# Patient Record
Sex: Female | Born: 1985 | Race: White | Hispanic: Yes | Smoking: Never smoker
Health system: Southern US, Community
[De-identification: ages and names within clinical notes are randomized; demographics above are authoritative.]

## PROBLEM LIST (undated history)

## (undated) HISTORY — PX: BREAST SURGERY: SHX581

---

## 2009-07-06 ENCOUNTER — Emergency Department (HOSPITAL_COMMUNITY): Admission: EM | Admit: 2009-07-06 | Discharge: 2009-07-06 | Payer: Self-pay | Admitting: Emergency Medicine

## 2010-02-03 ENCOUNTER — Emergency Department (HOSPITAL_COMMUNITY): Admission: EM | Admit: 2010-02-03 | Discharge: 2010-02-03 | Payer: Self-pay | Admitting: Emergency Medicine

## 2010-02-04 ENCOUNTER — Emergency Department (HOSPITAL_COMMUNITY): Admission: EM | Admit: 2010-02-04 | Discharge: 2010-02-04 | Payer: Self-pay | Admitting: Emergency Medicine

## 2010-12-02 LAB — URINALYSIS, ROUTINE W REFLEX MICROSCOPIC
Ketones, ur: 15 mg/dL — AB
Protein, ur: 100 mg/dL — AB
Urobilinogen, UA: 1 mg/dL (ref 0.0–1.0)

## 2010-12-02 LAB — URINE MICROSCOPIC-ADD ON

## 2010-12-02 LAB — RAPID STREP SCREEN (MED CTR MEBANE ONLY): Streptococcus, Group A Screen (Direct): NEGATIVE

## 2010-12-19 LAB — RAPID STREP SCREEN (MED CTR MEBANE ONLY): Streptococcus, Group A Screen (Direct): NEGATIVE

## 2010-12-19 LAB — URINALYSIS, ROUTINE W REFLEX MICROSCOPIC
Glucose, UA: NEGATIVE mg/dL
Protein, ur: NEGATIVE mg/dL
Specific Gravity, Urine: 1.018 (ref 1.005–1.030)
Urobilinogen, UA: 1 mg/dL (ref 0.0–1.0)

## 2010-12-19 LAB — URINE MICROSCOPIC-ADD ON

## 2011-10-31 ENCOUNTER — Encounter (HOSPITAL_COMMUNITY): Payer: Self-pay | Admitting: *Deleted

## 2011-10-31 ENCOUNTER — Emergency Department (HOSPITAL_COMMUNITY): Payer: Self-pay

## 2011-10-31 ENCOUNTER — Emergency Department (HOSPITAL_COMMUNITY)
Admission: EM | Admit: 2011-10-31 | Discharge: 2011-11-01 | Payer: Self-pay | Attending: Emergency Medicine | Admitting: Emergency Medicine

## 2011-10-31 DIAGNOSIS — S0003XA Contusion of scalp, initial encounter: Secondary | ICD-10-CM | POA: Insufficient documentation

## 2011-10-31 DIAGNOSIS — R220 Localized swelling, mass and lump, head: Secondary | ICD-10-CM | POA: Insufficient documentation

## 2011-10-31 DIAGNOSIS — S0180XA Unspecified open wound of other part of head, initial encounter: Secondary | ICD-10-CM | POA: Insufficient documentation

## 2011-10-31 DIAGNOSIS — IMO0002 Reserved for concepts with insufficient information to code with codable children: Secondary | ICD-10-CM

## 2011-10-31 MED ORDER — ACETAMINOPHEN 325 MG PO TABS
975.0000 mg | ORAL_TABLET | Freq: Once | ORAL | Status: AC
  Administered 2011-10-31: 975 mg via ORAL
  Filled 2011-10-31: qty 3

## 2011-10-31 MED ORDER — TETANUS-DIPHTH-ACELL PERTUSSIS 5-2.5-18.5 LF-MCG/0.5 IM SUSP
0.5000 mL | Freq: Once | INTRAMUSCULAR | Status: AC
  Administered 2011-10-31: 0.5 mL via INTRAMUSCULAR
  Filled 2011-10-31: qty 0.5

## 2011-10-31 MED ORDER — BACITRACIN ZINC 500 UNIT/GM EX OINT
TOPICAL_OINTMENT | CUTANEOUS | Status: AC
  Filled 2011-10-31: qty 0.9

## 2011-10-31 NOTE — ED Notes (Signed)
Patient transported to CT 
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
## 2011-11-01 NOTE — Discharge Instructions (Signed)
Laceration Care, Child A laceration is a cut that goes through all layers of the skin. The cut goes into the tissue beneath the skin. HOME CARE For stitches (sutures) or staples:  Keep the cut clean and dry.   If your child has a bandage (dressing), change it at least once a day. Change the bandage if it gets wet or dirty, or as told by your doctor.   Wash the cut with soap and water 2 times a day. Rinse the cut with water. Pat it dry with a clean towel.   Put a thin layer of medicated cream on the cut as told by your doctor.   Your child may shower after the first 24 hours. Do not soak the cut in water until the stitches are removed.   Only give medicines as told by your doctor.   Have the stitches or staples removed as told by your doctor.  For skin glue (adhesive) strips:  Keep the cut clean and dry.   Do not get the strips wet. Your child may take a bath, but be careful to keep the cut dry.   If the cut gets wet, pat it dry with a clean towel.   The strips will fall off on their own. Do not remove the strips that are still stuck to the cut.  For wound glue:  Your child may shower or take baths. Do not soak or scrub the cut. Do not swim. Avoid heavy sweating until the glue falls off on its own. After a shower or bath, pat the cut dry with a clean towel.   Do not put medicine on your child's cut until the glue falls off.   If your child has a bandage, do not put tape over the glue.   Avoid lots of sunlight or tanning lamps until the glue falls off. Put sunscreen on the cut for the first year to reduce the scar.   The glue will fall off on its own. Do not let your child pick at the glue.  Your child may need a tetanus shot if:  You cannot remember when your child had his or her last tetanus shot.   Your child has never had a tetanus shot.  If your child needs a tetanus shot and you choose not to have one, your child may get tetanus. Sickness from tetanus can be  serious. GET HELP RIGHT AWAY IF:   Your child's cut is red, puffy (swollen), or painful.   You see yellowish-white fluid (pus) coming from the cut.   You see a red line on the skin coming from the cut.   You notice a bad smell coming from the cut or bandage.   Your child has a fever.   Your baby is 75 months old or younger with a rectal temperature of 100.4 F (38 C) or higher.   Your child's cut breaks open.   You see something coming out of the cut, such as wood or glass.   Your child cannot move a finger or toe.   Your child's arm, hand, leg, or foot loses feeling (numbness) or changes color.  MAKE SURE YOU:   Understand these instructions.   Will watch your child's condition.   Will get help right away if your child is not doing well or gets worse.  Document Released: 06/10/2008 Document Revised: 05/14/2011 Document Reviewed: 03/06/2011 [facility] Patient Information 2012 [place], [place].  Take Tylenol as needed for pain, sutures to come out  in 5 days

## 2013-06-25 ENCOUNTER — Encounter (HOSPITAL_COMMUNITY): Payer: Self-pay | Admitting: Emergency Medicine

## 2013-06-25 ENCOUNTER — Emergency Department (HOSPITAL_COMMUNITY)
Admission: EM | Admit: 2013-06-25 | Discharge: 2013-06-26 | Disposition: A | Discharge status: Home / Self Care | Payer: PRIVATE HEALTH INSURANCE | Attending: Emergency Medicine | Admitting: Emergency Medicine

## 2013-06-25 DIAGNOSIS — N920 Excessive and frequent menstruation with regular cycle: Secondary | ICD-10-CM | POA: Insufficient documentation

## 2013-06-25 DIAGNOSIS — Z3202 Encounter for pregnancy test, result negative: Secondary | ICD-10-CM | POA: Insufficient documentation

## 2013-06-25 DIAGNOSIS — R109 Unspecified abdominal pain: Secondary | ICD-10-CM | POA: Insufficient documentation

## 2013-06-25 LAB — CBC
Hemoglobin: 12.8 g/dL (ref 12.0–15.0)
MCH: 30.2 pg (ref 26.0–34.0)
MCHC: 36.1 g/dL — ABNORMAL HIGH (ref 30.0–36.0)
MCV: 83.7 fL (ref 78.0–100.0)
RBC: 4.24 MIL/uL (ref 3.87–5.11)

## 2013-06-25 MED ORDER — NORETHINDRONE ACETATE 5 MG PO TABS: 5.0000 mg | ORAL_TABLET | Freq: Every day | ORAL | Status: DC

## 2013-06-25 MED ORDER — LEVONORGESTREL-ETHINYL ESTRAD 0.15-30 MG-MCG PO TABS: 1.0000 | ORAL_TABLET | Freq: Every day | ORAL | Status: DC

## 2013-06-25 NOTE — ED Provider Notes (Signed)
CSN: [id number]     Arrival date & time 06/25/13  2050 History   First MD Initiated Contact with Patient 06/25/13 2308     Chief Complaint  Patient presents with  . Vaginal Bleeding   (Consider location/radiation/quality/duration/timing/severity/associated sxs/prior Treatment) HPI This patient is a generally healthy young woman who presents with complaints of persistent daily vaginal bleeding x 15 days. She says she had the same sx in June. She saw a nurse practitioner at a local clinic and was treated with "a shot to stop the bleeding" and prescribed Indocin for cramping pain. Bleeding abated and the patient was amenorrheic for the next 3 months. She developed vaginal bleeding again about 15d ago and it has been persistent. She denies passage of large clots. She has been using approx 4-5 pads per day.   She has intermittent, midline lower abdominal cramping. No unusual vaginal discharge. States had a normal pap smear approx 1 year ago. Denies unusual vaginal discharge and dysuria.   History reviewed. No pertinent past medical history. History reviewed. No pertinent past surgical history. No family history on file. History  Substance Use Topics  . Smoking status: Never Smoker   . Smokeless tobacco: Not on file  . Alcohol Use: No   OB History   Grav Para Term Preterm Abortions TAB SAB Ect Mult Living                 Review of Systems Gen: no weight loss, fevers, chills, night sweats Eyes: no discharge or drainage, no occular pain or visual changes Nose: no epistaxis or rhinorrhea Mouth: no dental pain, no sore throat Neck: no neck pain Lungs: no SOB, cough, wheezing CV: no chest pain, palpitations, dependent edema or orthopnea Abd: no abdominal pain, nausea, vomiting GU: As per history of present illness otherwise negative MSK: no myalgias or arthralgias Neuro: no headache, no focal neurologic deficits Skin: no rash Psyche: negative.  Allergies  Review of patient's  allergies indicates no known allergies.  Home Medications  No current outpatient prescriptions on file. BP 114/76  Pulse 79  Temp(Src) 98.4 F (36.9 C) (Oral)  Resp 16  Wt 139 lb 3.2 oz (63.141 kg)  SpO2 99%  LMP 06/25/2013 Physical Exam Gen: well developed and well nourished appearing Head: NCAT Eyes: PERL, EOMI Nose: no epistaixis or rhinorrhea Mouth/throat: mucosa is moist and pink Neck: supple, no stridor Lungs: CTA B, no wheezing, rhonchi or rales CV: rr, no murmur, ext well perfused, cap refill < 2s Abd: soft, notender, nondistended Back: no ttp, no cva ttp Skin: warm and dry Neuro: CN ii-xii grossly intact, no focal deficits Psyche; normal affect,  calm and cooperative.   ED Course  Procedures (including critical care time) Labs Review  Results for orders placed during the hospital encounter of 06/25/13 (from the past 24 hour(s))  CBC     Status: Abnormal   Collection Time    06/25/13  9:09 PM      Result Value Range   WBC 8.1  4.0 - 10.5 K/uL   RBC 4.24  3.87 - 5.11 MIL/uL   Hemoglobin 12.8  12.0 - 15.0 g/dL   HCT 40.9 (*) 81.1 - 91.4 %   MCV 83.7  78.0 - 100.0 fL   MCH 30.2  26.0 - 34.0 pg   MCHC 36.1 (*) 30.0 - 36.0 g/dL   RDW 78.2  95.6 - 21.3 %   Platelets 258  150 - 400 K/uL  POCT PREGNANCY, URINE  Status: None   Collection Time    06/25/13 11:00 PM      Result Value Range   Preg Test, Ur NEGATIVE  NEGATIVE     MDM  Patient with menorrhagia. We will tx with norethidrone followed by OCP and refer to GYN for outpatient f/u. Patient is well insured. She is counseled to return to the ED for red flag sx.    Brandt Loosen, MD 06/25/13 [phone]

## 2013-06-25 NOTE — ED Notes (Signed)
Patient asked for urine sample, she states that she is unable to give sample at this time. 

## 2013-06-25 NOTE — ED Notes (Signed)
Moderate vaginal bleeding x 15 days; missed menstrual cycle last month. Went to pcp and prescribed 4 meds. Feeling weak. And umbilical pain.

## 2015-10-31 ENCOUNTER — Ambulatory Visit (INDEPENDENT_AMBULATORY_CARE_PROVIDER_SITE_OTHER): Payer: Managed Care, Other (non HMO) | Admitting: Family Medicine

## 2015-10-31 VITALS — BP 102/66 | HR 102 | Temp 98.8°F | Resp 18 | Ht 66.5 in | Wt 145.2 lb

## 2015-10-31 DIAGNOSIS — B349 Viral infection, unspecified: Secondary | ICD-10-CM

## 2015-10-31 DIAGNOSIS — R509 Fever, unspecified: Secondary | ICD-10-CM

## 2015-10-31 LAB — POCT INFLUENZA A/B
Influenza A, POC: NEGATIVE
Influenza B, POC: NEGATIVE

## 2015-10-31 MED ORDER — OSELTAMIVIR PHOSPHATE 75 MG PO CAPS: 75.0000 mg | ORAL_CAPSULE | Freq: Two times a day (BID) | ORAL | Status: DC

## 2015-10-31 NOTE — Addendum Note (Signed)
Addended by: Elenora Gamma on: 10/31/2015 10:16 AM   Modules accepted: Level of Service, SmartSet

## 2015-10-31 NOTE — Patient Instructions (Addendum)
Great to meet you!  Your flu test was negative, It misses about 3 out of 10 and you have all of the signs of flu in peak flu season so I have sent you the medicine for the flu  Wash your hand routinely  Please come back if you have any concerns  Influenza, Adult Influenza ("the flu") is a viral infection of the respiratory tract. It occurs more often in winter months because people spend more time in close contact with one another. Influenza can make you feel very sick. Influenza easily spreads from person to person (contagious). CAUSES  Influenza is caused by a virus that infects the respiratory tract. You can catch the virus by breathing in droplets from an infected person's cough or sneeze. You can also catch the virus by touching something that was recently contaminated with the virus and then touching your mouth, nose, or eyes. RISKS AND COMPLICATIONS You may be at risk for a more severe case of influenza if you smoke cigarettes, have diabetes, have chronic heart disease (such as heart failure) or lung disease (such as asthma), or if you have a weakened immune system. Elderly people and pregnant women are also at risk for more serious infections. The most common problem of influenza is a lung infection (pneumonia). Sometimes, this problem can require emergency medical care and may be life threatening. SIGNS AND SYMPTOMS  Symptoms typically last 4 to 10 days and may include:  Fever.  Chills.  Headache, body aches, and muscle aches.  Sore throat.  Chest discomfort and cough.  Poor appetite.  Weakness or feeling tired.  Dizziness.  Nausea or vomiting. DIAGNOSIS  Diagnosis of influenza is often made based on your history and a physical exam. A nose or throat swab test can be done to confirm the diagnosis. TREATMENT  In mild cases, influenza goes away on its own. Treatment is directed at relieving symptoms. For more severe cases, your health care provider may prescribe antiviral  medicines to shorten the sickness. Antibiotic medicines are not effective because the infection is caused by a virus, not by bacteria. HOME CARE INSTRUCTIONS  Take medicines only as directed by your health care provider.  Use a cool mist humidifier to make breathing easier.  Get plenty of rest until your temperature returns to normal. This usually takes 3 to 4 days.  Drink enough fluid to keep your urine clear or pale yellow.  Cover yourmouth and nosewhen coughing or sneezing,and wash your handswellto prevent thevirusfrom spreading.  Stay homefromwork orschool untilthe fever is gonefor at least 75full day. PREVENTION  An annual influenza vaccination (flu shot) is the best way to avoid getting influenza. An annual flu shot is now routinely recommended for all adults in the [place] SEEK MEDICAL CARE IF:  You experiencechest pain, yourcough worsens,or you producemore mucus.  Youhave nausea,vomiting, ordiarrhea.  Your fever returns or gets worse. SEEK IMMEDIATE MEDICAL CARE IF:  You havetrouble breathing, you become short of breath,or your skin ornails becomebluish.  You have severe painor stiffnessin the neck.  You develop a sudden headache, or pain in the face or ear.  You have nausea or vomiting that you cannot control. MAKE SURE YOU:   Understand these instructions.  Will watch your condition.  Will get help right away if you are not doing well or get worse.   This information is not intended to replace advice given to you by your health care provider. Make sure you discuss any questions you have with your health  care provider.   Document Released: 08/29/2000 Document Revised: 09/22/2014 Document Reviewed: 12/01/2011 Elsevier Interactive Patient Education [employer].

## 2015-10-31 NOTE — Progress Notes (Signed)
   HPI  Patient presents today here with acute illness, concern for flu  She describes rapid onset of symptoms including subjective fever, chills, malaise, body aches, cough, rhinnorhea, and intermittent dyspnea Monday night (24-36 hours ago)  She had worsening one day ago  She has a sick contact with similar illness at work  LMP 3 weeks ago without sexual activity since  Denies current dyspnea, chest pain or PO intolerance  PMH: Smoking status noted Social Hx updated and reviewed in EMR ROS: Per HPI  Objective: BP 102/66 mmHg  Pulse 102  Temp(Src) 98.8 F (37.1 C) (Oral)  Resp 18  Ht 5' 6.5" (1.689 m)  Wt 145 lb 3.2 oz (65.862 kg)  BMI 23.09 kg/m2  SpO2 99% Gen: NAD, alert, cooperative with exam HEENT: NCAT,TMs WNL, Nares with erythema, oropharynx clear, MMM CV: RRR, good S1/S2, no murmur Resp: CTABL, no wheezes, non-labored Ext: No edema, warm Neuro: Alert and oriented, No gross deficits  Assessment and plan:  # Viral illness, clinically Flu Treating as flu despite negative rapid flu, peak season currently No signs of CAP Tamiflu, supportive care discussed 2-4 days out of work She is on approx hour 36 of illness RTC if worsening or does not improve as expected.,    Orders Placed This Encounter  Procedures  . POCT Influenza A/B    Meds ordered this encounter  Medications  . IBUPROFEN PO    Sig: Take by mouth.    Murtis Sink, MD [facility] 10/31/2015, 9:31 AM

## 2016-05-17 ENCOUNTER — Emergency Department (HOSPITAL_COMMUNITY): Payer: Worker's Compensation

## 2016-05-17 ENCOUNTER — Encounter (HOSPITAL_COMMUNITY): Payer: Self-pay

## 2016-05-17 ENCOUNTER — Emergency Department (HOSPITAL_COMMUNITY)
Admission: EM | Admit: 2016-05-17 | Discharge: 2016-05-17 | Disposition: A | Discharge status: Home / Self Care | Payer: Worker's Compensation | Attending: Emergency Medicine | Admitting: Emergency Medicine

## 2016-05-17 DIAGNOSIS — Y990 Civilian activity done for income or pay: Secondary | ICD-10-CM | POA: Diagnosis not present

## 2016-05-17 DIAGNOSIS — S39012A Strain of muscle, fascia and tendon of lower back, initial encounter: Secondary | ICD-10-CM | POA: Diagnosis not present

## 2016-05-17 DIAGNOSIS — T148XXA Other injury of unspecified body region, initial encounter: Secondary | ICD-10-CM

## 2016-05-17 DIAGNOSIS — X500XXA Overexertion from strenuous movement or load, initial encounter: Secondary | ICD-10-CM | POA: Diagnosis not present

## 2016-05-17 DIAGNOSIS — Y929 Unspecified place or not applicable: Secondary | ICD-10-CM | POA: Diagnosis not present

## 2016-05-17 DIAGNOSIS — S3992XA Unspecified injury of lower back, initial encounter: Secondary | ICD-10-CM | POA: Diagnosis present

## 2016-05-17 DIAGNOSIS — Y9389 Activity, other specified: Secondary | ICD-10-CM | POA: Diagnosis not present

## 2016-05-17 DIAGNOSIS — M62830 Muscle spasm of back: Secondary | ICD-10-CM

## 2016-05-17 MED ORDER — CYCLOBENZAPRINE HCL 5 MG PO TABS: 5.0000 mg | ORAL_TABLET | Freq: Three times a day (TID) | ORAL | 0 refills | Status: DC | PRN

## 2016-05-17 MED ORDER — IBUPROFEN 400 MG PO TABS
600.0000 mg | ORAL_TABLET | Freq: Once | ORAL | Status: AC
  Administered 2016-05-17: 600 mg via ORAL
  Filled 2016-05-17: qty 1

## 2016-05-17 MED ORDER — NAPROXEN 375 MG PO TABS: 375.0000 mg | ORAL_TABLET | Freq: Two times a day (BID) | ORAL | 0 refills | Status: DC

## 2016-05-17 NOTE — ED Triage Notes (Signed)
Patient complains of cervical back pain that she describes as a burning feeling. 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

## 2016-05-17 NOTE — Discharge Instructions (Signed)
Do not take the muscle relaxant if driving as it will make you sleepy.  °

## 2016-05-17 NOTE — ED Notes (Signed)
Declined W/C at D/C and was escorted to lobby by RN. 

## 2016-05-17 NOTE — ED Provider Notes (Signed)
[facility]-EMERGENCY DEPT Provider Note   CSN: [id number] Arrival date & time: 05/17/16  1611  By signing my name below, I, Alyssa Grove, attest that this documentation has been prepared under the direction and in the presence of Kerrie Buffalo, NP. Electronically Signed: Alyssa Grove, ED Scribe. 05/17/16. 4:37 PM.  History   Chief Complaint Chief Complaint  Patient presents with  . Back Pain   The history is provided by the patient. No language interpreter was used.    HPI Comments: Lisa Mooney is a 30 y.o. female who presents to the Emergency Department complaining of sudden onset, constant cervical and thoracic back pain onset 2:30 PM today. She rates her pain as a 9/10. Pt states she was at work and while lifting heavy plates, she began experiencing back pain. Pt reports she has taken Tylenol, but with no relief to pain.  Pt denies previous back problems or injuries. Pt states she does not currently take any medications. Pt denies nausea, vomiting, chest pain, abdominal pain.  History reviewed. No pertinent past medical history.  There are no active problems to display for this patient.   History reviewed. No pertinent surgical history.  OB History    No data available       Home Medications    Prior to Admission medications   Medication Sig Start Date End Date Taking? Authorizing Provider  cyclobenzaprine (FLEXERIL) 5 MG tablet Take 1 tablet (5 mg total) by mouth 3 (three) times daily as needed for muscle spasms. 05/17/16   Hope Orlene Och, NP  IBUPROFEN PO Take by mouth.    Historical Provider, MD  levonorgestrel-ethinyl estradiol (LEVORA 0.15/30, 28,) 0.15-30 MG-MCG tablet Take 1 tablet by mouth daily. Patient not taking: Reported on 10/31/2015 06/25/13   Brandt Loosen, MD  naproxen (NAPROSYN) 375 MG tablet Take 1 tablet (375 mg total) by mouth 2 (two) times daily. 05/17/16   Hope Orlene Och, NP  norethindrone (AYGESTIN) 5 MG tablet Take 1 tablet (5 mg total) by mouth  daily. Patient not taking: Reported on 10/31/2015 06/25/13   Brandt Loosen, MD  oseltamivir (TAMIFLU) 75 MG capsule Take 1 capsule (75 mg total) by mouth 2 (two) times daily. 10/31/15   Elenora Gamma, MD    Family History No family history on file.  Social History Social History  Substance Use Topics  . Smoking status: Never Smoker  . Smokeless tobacco: Never Used  . Alcohol use No     Allergies   Review of patient's allergies indicates no known allergies.   Review of Systems Review of Systems  Constitutional: Negative for fever.  Cardiovascular: Negative for chest pain.  Gastrointestinal: Negative for abdominal pain, nausea and vomiting.  Musculoskeletal: Positive for back pain.  Skin: Negative for wound.  Neurological: Negative for weakness.  Psychiatric/Behavioral: The patient is not nervous/anxious.    Physical Exam Updated Vital Signs BP 100/71   Pulse 94   Temp 98.1 F (36.7 C) (Oral)   Resp 18   LMP 04/28/2016   SpO2 99%   Physical Exam  Constitutional: She is oriented to person, place, and time. She appears well-developed and well-nourished. No distress.  HENT:  Head: Normocephalic and atraumatic.  Right Ear: Tympanic membrane normal.  Left Ear: Tympanic membrane normal.  Nose: Nose normal.  Mouth/Throat: Uvula is midline, oropharynx is clear and moist and mucous membranes are normal.  Eyes: Conjunctivae and EOM are normal.  Neck: Normal range of motion. Neck supple.  Cardiovascular: Normal rate and regular rhythm.  Pulmonary/Chest: Effort normal. No respiratory distress. She has no wheezes. She has no rales.  Abdominal: Soft. Bowel sounds are normal. There is no tenderness.  Musculoskeletal: Normal range of motion.       Cervical back: She exhibits tenderness, bony tenderness and spasm. She exhibits normal range of motion, no deformity and normal pulse.       Thoracic back: She exhibits tenderness and spasm. She exhibits normal range of motion and  normal pulse.  Neurological: She is alert and oriented to person, place, and time. She has normal strength. No cranial nerve deficit or sensory deficit. Gait normal.  Reflex Scores:      Bicep reflexes are 2+ on the right side and 2+ on the left side.      Brachioradialis reflexes are 2+ on the right side and 2+ on the left side.      Patellar reflexes are 2+ on the right side and 2+ on the left side. Skin: Skin is warm and dry.  Psychiatric: She has a normal mood and affect. Her behavior is normal.  Nursing note and vitals reviewed.   ED Treatments / Results  DIAGNOSTIC STUDIES: Oxygen Saturation is 99% on RA, normal by my interpretation.    COORDINATION OF CARE: 4:32 PM Discussed treatment plan with pt at bedside which includes Ibuprofen and DG Cervical Spine Complete and pt agreed to plan.  Labs (all labs ordered are listed, but only abnormal results are displayed) Labs Reviewed - No data to display  Radiology Dg Cervical Spine Complete  Result Date: 05/17/2016 CLINICAL DATA:  Acute onset neck pain today while lifting heavy object. Initial encounter. EXAM: CERVICAL SPINE - COMPLETE 4+ VIEW COMPARISON:  None. FINDINGS: There is no evidence of cervical spine fracture or prevertebral soft tissue swelling. Alignment is normal. No other significant bone abnormalities are identified. IMPRESSION: Negative cervical spine radiographs. Electronically Signed   By: Myles RosenthalJohn  Stahl M.D.   On: 05/17/2016 17:34   Dg Thoracic Spine 2 View  Result Date: 05/17/2016 CLINICAL DATA:  Acute onset thoracic back pain today while lifting heavy object. Initial encounter. EXAM: THORACIC SPINE 2 VIEWS COMPARISON:  None. FINDINGS: There is no evidence of thoracic spine fracture. Alignment is normal. No other significant bone abnormalities are identified. IMPRESSION: Negative thoracic spine radiographs. Electronically Signed   By: Myles RosenthalJohn  Stahl M.D.   On: 05/17/2016 17:35    Procedures Procedures (including critical  care time)  Medications Ordered in ED Medications  ibuprofen (ADVIL,MOTRIN) tablet 600 mg (600 mg Oral Given 05/17/16 1751)     Initial Impression / Assessment and Plan / ED Course  I have reviewed the triage vital signs and the nursing notes.  Pertinent imaging results that were available during my care of the patient were reviewed by me and considered in my medical decision making (see chart for details).  Clinical Course   Patient with back pain.  No neurological deficits and normal neuro exam.  Patient is ambulatory.  No loss of bowel or bladder control.  No concern for cauda equina.  Normal x-rays.  Supportive care and return precaution discussed. Appears safe for discharge at this time. Follow up as indicated in discharge paperwork.    Final Clinical Impressions(s) / ED Diagnoses   Final diagnoses:  Muscle spasm of back  Muscle strain    New Prescriptions New Prescriptions   CYCLOBENZAPRINE (FLEXERIL) 5 MG TABLET    Take 1 tablet (5 mg total) by mouth 3 (three) times daily as needed for muscle  spasms.   NAPROXEN (NAPROSYN) 375 MG TABLET    Take 1 tablet (375 mg total) by mouth 2 (two) times daily.   I personally performed the services described in this documentation, which was scribed in my presence. The recorded information has been reviewed and is accurate.     [place], NP 05/17/16 1800    Glynn Octave, MD 05/17/16 [phone]

## 2016-07-18 ENCOUNTER — Ambulatory Visit (INDEPENDENT_AMBULATORY_CARE_PROVIDER_SITE_OTHER): Payer: Managed Care, Other (non HMO) | Admitting: Family Medicine

## 2016-07-18 VITALS — BP 102/72 | HR 80 | Temp 98.5°F | Resp 17 | Ht 66.5 in | Wt 146.0 lb

## 2016-07-18 DIAGNOSIS — Z23 Encounter for immunization: Secondary | ICD-10-CM | POA: Diagnosis not present

## 2016-07-18 DIAGNOSIS — N644 Mastodynia: Secondary | ICD-10-CM

## 2016-07-18 DIAGNOSIS — T85848A Pain due to other internal prosthetic devices, implants and grafts, initial encounter: Secondary | ICD-10-CM | POA: Diagnosis not present

## 2016-07-18 MED ORDER — MELOXICAM 15 MG PO TABS: 15.0000 mg | ORAL_TABLET | Freq: Every day | ORAL | 0 refills | Status: DC

## 2016-07-18 NOTE — Patient Instructions (Addendum)
Do not use the meloxicam with any other otc pain medication other than tylenol/acetaminophen - so no aleve, ibuprofen, motrin, advil, etc.  Minimize caffeine, take a prenatal vitamin daily.   IF you received an x-ray today, you will receive an invoice from [facility]. Please contact [facility] at [phone] with questions or concerns regarding your invoice.   IF you received labwork today, you will receive an invoice from [facility]. Please contact [facility] at [phone] with questions or concerns regarding your invoice.   Our billing staff will not be able to assist you with questions regarding bills from these companies.  You will be contacted with the lab results as soon as they are available. The fastest way to get your results is to activate your My Chart account. Instructions are located on the last page of this paperwork. If you have not heard from us regarding the results in 2 weeks, please contact this office.     Ejercicios despus de Cipriano Mileuna ciruga de mama  (Exercises Following Breast Surgery) Despus cualquiera de las cirugas de los ganglios Freevilleaxilares, Eritreahaya tenido tratamiento de radiacin o no, los ejercicios pueden ayudarle a Programme researcher, broadcasting/film/videovolver a sus actividades normales y a su vida anterior. Antes de comenzar cualquier ejercicio hable con su mdico acerca de qu tipo de ejercicios sern adecuados para usted. El mdico puede recomendarle fisioterapia para ayudarla, especialmente si no ve ningn progreso en un mes de ejercicio. Algunos ejercicios ligeros se pueden hacer inmediatamente despus de la ciruga, pero si hay drenajes y suturas deben retirarse antes de hacer ejercicios prolongados o pesados. En general, los ejercicios disminuirn los problemas despus de la Azerbaijanciruga. Generalmente, puede esperarse recuperar el rango de movimiento del brazo en 4 a 6 semanas.  INSTRUCCIONES PARA EL CUIDADO EN EL HOGAR  Estos ejercicios deben Cox Communicationshacerse durante  los primeros 3 a 7 das despus de la Azerbaijanciruga, pero slo con la autorizacin del mdico.   Use el brazo afectado (del lado donde se realiz la ciruga) como lo hara normalmente para peinarse, baarse, vestirse y Arts administratorcomer.  Levante el brazo afectado por encima del nivel del corazn durante 45 minutos 2 a 3 veces al da, mientras est acostada. Ponga el brazo sobre almohadas para que la mano est ms alta que la mueca y el codo un poco ms alto que el hombro. Esto ayudar a disminuir la hinchazn que ocurre despus de la Azerbaijanciruga.  Ejercite el brazo Halliburton Companyafectado mientras lo eleva por encima del nivel del corazn, abriendo y cerrando la mano de 15 a 25 veces. Luego, doble y estire el codo. Repita esto 3 a 4 veces al da. Este ejercicio ayuda a reducir la inflamacin mediante el bombeo del lquido linftico del brazo.  Practique ejercicios de respiracin profunda (con el diafragma) por lo menos 6 veces al da. Acostada sobre la espalda, inspire de manera lenta y profunda. Inspire tanto aire como pueda tratando de ampliar el pecho y el abdomen (empuje el ombligo lejos de la columna vertebral). Reljese y exhale. Repita 4 o 5 veces. Este ejercicio la ayudar a Radio producermantener el movimiento normal del pecho, facilitando la expansin de los pulmones. A partir de este momento contine haciendo ejercicios de respiracin profunda.  Evite dormir CDW Corporationsobre el brazo afectado o de ese lado.  No levante objetos que pesen ms de 5 libras.  Deje de hacer los ejercicios si siente dolor en el pecho, el brazo o el hombro, y llame a su mdico.  Informe a su mdico o terapeuta  si su brazo se hincha despus de hacer ejercicios.  Haga los ejercicios frente a un espejo. De esta manera usted podr si se ejercita en una postura correcta y con el movimiento que se recomienda.  No use la almohadilla trmica sobre el brazo del lado de la [place]. PAUTAS GENERALES PARA REALIZAR EJERCICIOS  Podr hacer los ejercicios descritos aqu tan pronto  como su mdico la autorice. Asegrese de hablar con su mdico antes de intentar hacer cualquiera de ellos.   Sentir algo de presin en el pecho y la axila despus de la [place]. Esto es normal. La tensin disminuir a medida que avance en su programa de ejercicios.  Muchas mujeres sienten una sensacin de ardor, hormigueo, entumecimiento o dolor en la parte posterior del brazo o la pared torcica. Esto se debe a que la ciruga irrita algunas de sus terminaciones nerviosas. Aunque las sensaciones pueden aumentar un par de semanas despus de la [place], siga haciendo los ejercicios a menos que note una hinchazn o sensibilidad inusual. (Infrmele a su mdico si esto ocurre.) A veces frotar o masajear la zona con la mano o con un pao suave puede ayudar a que el rea est menos sensible.  Puede ser til hacer ejercicios despus de una ducha caliente, cuando los msculos estn calientes y relajados.  Use ropa cmoda, suelta al hacer los ejercicios.  Haga los ejercicios hasta sentir una lenta elongacin. Mantenga cada estiramiento al final del movimiento contando hasta cinco. Es normal sentir un tironeo Engineer, manufacturing systems se estira la piel y los msculos que se han acortado debido a Metallurgist. No haga rebotes ni movimientos errticos al ARAMARK Corporation ejercicios. Usted no debe sentir dolor cuando The ServiceMaster Company ejercicios, slo estiramientos suaves.  Haga 5 a 7 repeticiones de cada ejercicio. Trate de hacer cada ejercicio correctamente. Si tiene alguna dificultad con los ejercicios, consulte a su mdico. Puede necesitar la derivacin a un terapeuta fsico u ocupacional.  Los ejercicios deben [employer] Waste Industries veces al da hasta que recupere la flexibilidad y la fuerza normal.  Asegrese de hacer respiraciones profundas, hacia adentro y afuera, a medida que realiza cada ejercicio.  Los ejercicios estn diseados para que usted los inicie [place], se siente y luego termine de pie. EJERCICIOS EN POSICIN ACOSTADA  Estos  ejercicios deben realizarse en la cama o en el suelo, acostada sobre su espalda. Mantenga las rodillas y las caderas dobladas y los pies planos.  Ejercicio con Neomia Dear vara  Este ejercicio ayuda a aumentar el movimiento hacia adelante de los hombros. Pension scheme manager un palo de escoba, vara u otro objeto similar para Tree surgeon.   Sostenga la vara en las manos con las palmas [place].  Levante la vara sobre su cabeza (tanto como pueda) use el brazo no afectado para ayudar a Printmaker vara, hasta que sienta un estiramiento en el brazo afectado.  Mantenga durante cinco segundos.  Baje los brazos y repita 5 a 7 veces. Aleteo con el codo  Este ejercicio ayuda a aumentar la movilidad de la parte delantera del pecho y [place]. Puede tomar varias semanas de ejercicio regular antes de los codos se acerquen a la cama (o al suelo).   Junte las manos detrs de la nuca y apunte con los codos [place].  Mueva los codos separndolos y bajndolos, hacia la cama (o al suelo).  Repita entre 5 y 7 veces. EJERCICIOS EN POSICIN Hewlett-Packard  Elongacin del omplato  Este ejercicio ayuda a aumentar la movilidad de los  omplatos.   Sintese en una silla al lado de una mesa.  Coloque el brazo no afectado sobre la mesa con el codo doblado y la palma Moose Runhacia abajo. No mueva el brazo durante el ejercicio.  Coloque el brazo IAC/InterActiveCorpafectado sobre la mesa, con la palma Denairhacia abajo y el codo estirado.  Sin mover el tronco, deslice el brazo afectado hacia el lado opuesto de la mesa. Debe sentir el movimiento del hombro a medida que lo hace.  Relaje su brazo y repita 5 a 7 veces. Compresin del omplato  Este ejercicio tambin ayuda a aumentar la movilidad del omplato.   Sintese en una silla delante de un espejo sin apoyarse en el respaldo de la silla.  Los brazos deben estar a los lados con los codos doblados.  Apriete los omplatos, llevando los codos Du Ponthacia atrs. Mantenga los hombros a nivel a medida  que hace este ejercicio. No levante los hombros Time Warnerhacia las orejas.  Vuelva a la posicin inicial y repita 5-7 veces. Flexin lateral Este ejercicio ayuda a mejorar la movilidad del tronco y el cuerpo.   Junte las Exxon Mobil Corporationmanos adelante y CenterPoint Energylevante los brazos sobre la cabeza lentamente, Viningestirando los brazos.  Cuando los brazos estn sobre la cabeza, doble el tronco hacia la derecha mientras se dobla en la cintura y Lehman Brothersmanteniendo los brazos en alto.  Vuelva a la posicin inicial y doble a la izquierda.  Repita entre 5 y 7 veces. EJERCICIOS EN POSICIN DE PIE  Estiramiento de la pared torcica  Este ejercicio ayuda a Therapist, musicestirar la pared torcica.   Prese frente a un rincn con los pies aproximadamente 8 a 10 pulgadas (20 a 25 cm) del mismo.  Doble los codos y Land O'Lakescoloque los antebrazos en la pared, uno a cada lado del rincn. Los codos deben estar tan cerca de la altura del hombro como sea posible.  Mantenga los brazos y los pies en la posicin y Edgemoormueva el pecho hacia el rincn. Sentir un estiramiento en el pecho y los hombros.  Vuelva a la posicin inicial y repita 5 a 7 veces. Estiramiento del hombro  Este ejercicio ayuda a aumentar la movilidad del hombro.   Prese frente a la pared con los pies aproximadamente 8 a 10 pulgadas (20 a 25 cm) de la pared.  Coloque las manos en la pared. Use sus dedos para "subir la pared", llegando tan alto como pueda hasta sentir un estiramiento.  Vuelva a la posicin inicial y repita 5 a 7 veces. PARA TENER EN CUENTA  Comience a Firefighterhacer los ejercicios lentamente y avance tanto como le sea posible. Deje de ARAMARK Corporationhacer los ejercicios y llame a su mdico si:   Se siente ms dbil, comienza a perder el equilibrio o a caerse.  Siente un dolor que Solisempeora.  Siente una nueva pesadez en el brazo.  Observa una hinchazn inusual o la hinchazn empeora.  Siente dolor de cabeza, Gulf Park Estatesmareos, visin borrosa, entumecimiento u hormigueo CSX Corporationnuevo en los brazos o en el pecho. Es Aeronautical engineerimportante  hacer ejercicio para Armed forces technical officermantener los msculos en funcioamiento lo mejor posible, pero tambin es importante estar seguro. Hable con su mdico acerca de ejercicios realistas para su afeccin. Luego, establezca metas para aumentar su nivel de actividad fsica.    Esta informacin no tiene Theme park managercomo fin reemplazar el consejo del mdico. Asegrese de hacerle al mdico cualquier pregunta que tenga.   Document Released: 08/18/2012 Document Revised: 12/27/2012 Elsevier Interactive Patient Education [employer]2016 Elsevier Inc.

## 2016-07-18 NOTE — Progress Notes (Signed)
Subjective:  By signing my name below, I, Stann Oresung-Kai Tsai, attest that this documentation has been prepared under the direction and in the presence of Norberto SorensonEva Shaw, MD. Electronically Signed: Stann Oresung-Kai Tsai, Scribe. 07/18/2016 , 5:19 PM .  Patient was seen in Room 1 .   Patient ID: Lisa Mooney, female    DOB: February 21, 1986, 30 y.o.   MRN: [id number] Chief Complaint  Patient presents with  . Breast Pain    Rt onset 3 days   HPI Lisa Mooney is a 30 y.o. female who [facility] of right breast pain that started 3 days ago. She had 400cc breast implants placed 7 mos previously [place].  She had mammogram and US done prior to ensure her breast tissue was normal.  She thinks that perhaps the right implant may have been 10cc larger than the left due to difference in the breast sizes.  Ever since the surgery, she has continued to have periodic pain along the lateral chest wall and upper outer quadrant of the right breast.  This has been continuing to occur sporadically and worsen.  No skin changes, no nipple problems, discharge.  No past medical history on file. Prior to Admission medications   Not on File   No Known Allergies  Review of Systems  Constitutional: Negative for activity change and appetite change.  Respiratory: Negative for cough, chest tightness and shortness of breath.   Cardiovascular: Positive for chest pain. Negative for leg swelling.       Breast pain  Skin: Negative for color change, pallor, rash and wound.  Hematological: Negative for adenopathy. Does not bruise/bleed easily.       Objective:   Physical Exam  Constitutional: She is oriented to person, place, and time. She appears well-developed and well-nourished. No distress.  HENT:  Head: Normocephalic and atraumatic.  Eyes: EOM are normal. Pupils are equal, round, and reactive to light.  Neck: Neck supple.  Cardiovascular: Normal rate.   Pulmonary/Chest: Effort normal. No  respiratory distress.  Tenderness and fibrocystic type tissue over the latearl chest wall at base of the right breast. The upper outer quadrant of the right breast feels much more firm and full than the left.  Genitourinary: There is breast tenderness. No breast swelling or discharge.  Musculoskeletal: Normal range of motion.  Neurological: She is alert and oriented to person, place, and time.  Skin: Skin is warm and dry.  Psychiatric: She has a normal mood and affect. Her behavior is normal.  Nursing note and vitals reviewed.   BP 102/72 (BP Location: Right Arm, Patient Position: Sitting, Cuff Size: Normal)   Pulse 80   Temp 98.5 F (36.9 C) (Oral)   Resp 17   Ht 5' 6.5" (1.689 m)   Wt 146 lb (66.2 kg)   LMP 07/17/2016   SpO2 98%   BMI 23.21 kg/m     Assessment & Plan:   1. Pain from breast implant, initial encounter   2. Pain of right breast   3. Need for prophylactic vaccination and inoculation against influenza    Has been 7 mos since her breast implants and she continues to have problems with pain at the right lateral aspect.  There does seem to possibly be some firmness and increased tissue on the right lateral chest wall at the area of her pain.  Start a course of nsaids and refer for US to further eval.   Will proceed with Plastics c/s for further eval since pt is  not going to be able to return to the original surgeon in [place].  Orders Placed This Encounter  Procedures  . US BREAST LTD UNI RIGHT INC AXILLA    Standing Status:   Future    Standing Expiration Date:   09/17/2017    Order Specific Question:   Reason for Exam (SYMPTOM  OR DIAGNOSIS REQUIRED)    Answer:   pain, fullness, cystic breast tissue along lateral aspect of right breast since breast implants in April    Order Specific Question:   Preferred imaging [facility]?    [facility]  . Flu Vaccine QUAD 36+ mos IM  . Ambulatory referral to Plastic Surgery    Referral Priority:   Routine     Referral Type:   Surgical    Referral Reason:   Specialty Services Required    Requested Specialty:   Plastic Surgery    Number of Visits Requested:   1    Meds ordered this encounter  Medications  . meloxicam (MOBIC) 15 MG tablet    Sig: Take 1 tablet (15 mg total) by mouth daily.    Dispense:  30 tablet    Refill:  0    I personally performed the services described in this documentation, which was scribed in my presence. The recorded information has been reviewed and considered, and addended by me as needed.   Norberto SorensonEva Shaw, M.D.  [facility]  [place] [address] [place], [place] [address] [phone] phone [phone] fax  07/18/16 7:20 PM

## 2016-09-18 ENCOUNTER — Ambulatory Visit (INDEPENDENT_AMBULATORY_CARE_PROVIDER_SITE_OTHER): Payer: Managed Care, Other (non HMO)

## 2016-09-18 ENCOUNTER — Ambulatory Visit (INDEPENDENT_AMBULATORY_CARE_PROVIDER_SITE_OTHER): Payer: Managed Care, Other (non HMO) | Admitting: Family Medicine

## 2016-09-18 VITALS — BP 116/68 | HR 100 | Temp 98.5°F | Resp 16 | Ht 66.5 in | Wt 148.8 lb

## 2016-09-18 DIAGNOSIS — R05 Cough: Secondary | ICD-10-CM | POA: Diagnosis not present

## 2016-09-18 DIAGNOSIS — R059 Cough, unspecified: Secondary | ICD-10-CM

## 2016-09-18 MED ORDER — AZITHROMYCIN 250 MG PO TABS: ORAL_TABLET | ORAL | 0 refills | Status: DC

## 2016-09-18 MED ORDER — HYDROCODONE-HOMATROPINE 5-1.5 MG/5ML PO SYRP: 5.0000 mL | ORAL_SOLUTION | Freq: Three times a day (TID) | ORAL | 0 refills | Status: DC | PRN

## 2016-09-18 NOTE — Progress Notes (Signed)
   SUBJECTIVE: URI symptoms:  Lisa GandyMaria Mooney is a 31 y.o. female who complains of URI symptoms present for past week.  Now worsening.  Worst symptoms are sinus congestion and moderate cough.  Started with rhinorrhea, but congestion has worsened.   Has tried no OTC meds.  Sick contacts are none.  Subjective chills.. No nausea or vomiting.  Denies smoking cigarettes.  ROS as above.    PMH reviewed. Patient is a nonsmoker.   Medications reviewed.  Physical Exam:  BP 116/68   Pulse 100   Temp 98.5 F (36.9 C) (Oral)   Resp 16   Ht 5' 6.5" (1.689 m)   Wt 148 lb 12.8 oz (67.5 kg)   SpO2 98%   BMI 23.66 kg/m  Gen:  Patient sitting on exam table, appears stated age in no acute distress Head: Normocephalic atraumatic Eyes: EOMI, PERRL, sclera and conjunctiva non-erythematous Ears:  Canals clear bilaterally.  TMs pearly gray bilaterally without erythema or bulging.   Nose:  Nasal turbinates grossly enlarged bilaterally. Some exudates noted. Tender to palpation of maxillary sinus  Mouth: Mucosa membranes moist. Tonsils +2, nonenlarged, non-erythematous. Neck: No cervical lymphadenopathy noted Heart:  RRR, no murmurs auscultated. Pulm:  Clear to auscultation bilaterally with good air movement.  No wheezes or rales noted.    Assessment and Plan:  1.  Sinus infection: - treat with Zpak.   - Hycodan for associated cough.  CXR negative

## 2016-09-18 NOTE — Patient Instructions (Addendum)
  You have a sinus infection.    Take the azithromycin Take 2 pills today and then 1 pill daily after that.    Take the Hycodan for cough syrup at night.    It was good to meet you today   IF you received an x-ray today, you will receive an invoice from [facility]. Please contact [facility] at [phone] with questions or concerns regarding your invoice.   IF you received labwork today, you will receive an invoice from [facility]. Please contact [facility] at [phone] with questions or concerns regarding your invoice.   Our billing staff will not be able to assist you with questions regarding bills from these companies.  You will be contacted with the lab results as soon as they are available. The fastest way to get your results is to activate your My Chart account. Instructions are located on the last page of this paperwork. If you have not heard from us regarding the results in 2 weeks, please contact this office.

## 2017-01-13 ENCOUNTER — Ambulatory Visit (INDEPENDENT_AMBULATORY_CARE_PROVIDER_SITE_OTHER): Payer: Managed Care, Other (non HMO) | Admitting: Student

## 2017-01-13 ENCOUNTER — Encounter: Payer: Self-pay | Admitting: Student

## 2017-01-13 VITALS — BP 102/70 | HR 87 | Temp 98.5°F | Resp 16 | Ht 66.5 in | Wt 149.0 lb

## 2017-01-13 DIAGNOSIS — N912 Amenorrhea, unspecified: Secondary | ICD-10-CM | POA: Diagnosis not present

## 2017-01-13 DIAGNOSIS — N926 Irregular menstruation, unspecified: Secondary | ICD-10-CM | POA: Diagnosis not present

## 2017-01-13 DIAGNOSIS — Z8349 Family history of other endocrine, nutritional and metabolic diseases: Secondary | ICD-10-CM | POA: Insufficient documentation

## 2017-01-13 DIAGNOSIS — R634 Abnormal weight loss: Secondary | ICD-10-CM | POA: Diagnosis not present

## 2017-01-13 LAB — POCT URINE PREGNANCY: Preg Test, Ur: NEGATIVE

## 2017-01-13 NOTE — Assessment & Plan Note (Signed)
Pregnancy test negative.  2 month follow up for depo-provera

## 2017-01-13 NOTE — Patient Instructions (Signed)
     IF you received an x-ray today, you will receive an invoice from [facility]. Please contact [facility] at [phone] with questions or concerns regarding your invoice.   IF you received labwork today, you will receive an invoice from [facility]. Please contact [facility] at [phone] with questions or concerns regarding your invoice.   Our billing staff will not be able to assist you with questions regarding bills from these companies.  You will be contacted with the lab results as soon as they are available. The fastest way to get your results is to activate your My Chart account. Instructions are located on the last page of this paperwork. If you have not heard from us regarding the results in 2 weeks, please contact this office.     

## 2017-01-13 NOTE — Progress Notes (Signed)
   Subjective:    Patient ID: Lisa Mooney, female    DOB: 1986-03-14, 31 y.o.   MRN: [id number]  HPI Presents with concerns because has a family history in mom and aunt of hyperthyroidism.  They are currently being treated for this.  She has variations in her weight, diarrhea, palpitations, anxiety.  She will get irregular menses when not on depo. She is currently on depo, which she started in [place]. She started 1 month ago. She has been sexually active with 1 partner.  She is concerned for pregnancy.  No concerns for STD's.  PMHx - Updated and reviewed.  Contributory factors include: Negative PSHx - Updated and reviewed.  Contributory factors include:  Negative FHx - Updated and reviewed.  Contributory factors include:  Hyperthyroidism in mom and aunt Social Hx - Updated and reviewed. Contributory factors include: Negative, from [place] Medications - reviewed    Review of Systems  Constitutional: Positive for unexpected weight change. Negative for appetite change, chills, fatigue and fever.  HENT: Negative for congestion and rhinorrhea.   Eyes: Negative for photophobia and visual disturbance.  Respiratory: Negative for cough, chest tightness and shortness of breath.   Cardiovascular: Positive for palpitations. Negative for chest pain and leg swelling.  Gastrointestinal: Positive for diarrhea. Negative for abdominal pain, nausea and vomiting.  Endocrine: Positive for heat intolerance. Negative for cold intolerance.  Genitourinary: Negative for dysuria and urgency.  Musculoskeletal: Negative for arthralgias and joint swelling.  Skin: Negative for rash and wound.  Neurological: Negative for tremors and weakness.  Psychiatric/Behavioral: Negative for agitation and confusion. The patient is nervous/anxious.   All other systems reviewed and are negative.      Objective:   Physical Exam  Constitutional: She is oriented to person, place, and time. She appears well-developed  and well-nourished. No distress.  HENT:  Head: Normocephalic and atraumatic.  Right Ear: External ear normal.  Left Ear: External ear normal.  Neck: Normal range of motion. Neck supple. No thyromegaly present.  Cardiovascular: Normal rate, regular rhythm, normal heart sounds and intact distal pulses.  Exam reveals no gallop and no friction rub.   No murmur heard. Pulmonary/Chest: Effort normal and breath sounds normal. No respiratory distress. She has no wheezes. She has no rales. She exhibits no tenderness.  Musculoskeletal: Normal range of motion. She exhibits no edema.  Neurological: She is alert and oriented to person, place, and time.  Skin: Skin is warm. No rash noted. She is not diaphoretic. No erythema.  Psychiatric: She has a normal mood and affect. Her behavior is normal. Judgment and thought content normal.  Nursing note and vitals reviewed.  BP 102/70   Pulse 87   Temp 98.5 F (36.9 C) (Oral)   Resp 16   Ht 5' 6.5" (1.689 m)   Wt 149 lb (67.6 kg)   LMP 12/07/2016   SpO2 99%   BMI 23.69 kg/m         Assessment & Plan:  Amenorrhea due to Depo Provera Pregnancy test negative.  2 month follow up for depo-provera  Family history of hyperthyroidism Will check thyroid labs and call with results.  Signed,  Corliss Marcus, DO [facility] 5:25 PM 01/13/17

## 2017-01-13 NOTE — Assessment & Plan Note (Signed)
Will check thyroid labs and call with results.

## 2017-01-14 ENCOUNTER — Telehealth: Payer: Self-pay | Admitting: General Practice

## 2017-01-14 LAB — T4, FREE: Free T4: 1.44 ng/dL (ref 0.82–1.77)

## 2017-01-14 LAB — TSH: TSH: 1.17 u[IU]/mL (ref 0.450–4.500)

## 2017-01-14 LAB — T3, FREE: T3 FREE: 3.3 pg/mL (ref 2.0–4.4)

## 2017-01-14 NOTE — Telephone Encounter (Signed)
Pt would like a CB concerning her lab results. Please advise at [phone]

## 2017-01-15 NOTE — Telephone Encounter (Signed)
Labs results have not been released yet. Please advise

## 2017-01-16 NOTE — Telephone Encounter (Signed)
Called pt and left message again that lab results are normal and CB if want to discuss.   Signed,  Corliss MarcusAlicia Barnes, DO [facility] 11:49 AM

## 2017-10-29 ENCOUNTER — Ambulatory Visit: Payer: Managed Care, Other (non HMO) | Admitting: Family Medicine

## 2017-10-29 ENCOUNTER — Encounter: Payer: Self-pay | Admitting: Family Medicine

## 2017-10-29 ENCOUNTER — Other Ambulatory Visit: Payer: Self-pay

## 2017-10-29 VITALS — BP 108/76 | HR 97 | Temp 98.5°F | Ht 68.0 in | Wt 147.0 lb

## 2017-10-29 DIAGNOSIS — J101 Influenza due to other identified influenza virus with other respiratory manifestations: Secondary | ICD-10-CM

## 2017-10-29 DIAGNOSIS — Z30013 Encounter for initial prescription of injectable contraceptive: Secondary | ICD-10-CM

## 2017-10-29 DIAGNOSIS — R6889 Other general symptoms and signs: Secondary | ICD-10-CM | POA: Diagnosis not present

## 2017-10-29 LAB — POC INFLUENZA A&B (BINAX/QUICKVUE)
Influenza A, POC: POSITIVE — AB
Influenza B, POC: NEGATIVE

## 2017-10-29 LAB — POCT URINE PREGNANCY: Preg Test, Ur: NEGATIVE

## 2017-10-29 MED ORDER — IBUPROFEN 600 MG PO TABS: 600.0000 mg | ORAL_TABLET | Freq: Three times a day (TID) | ORAL | 0 refills | Status: DC | PRN

## 2017-10-29 MED ORDER — MEDROXYPROGESTERONE ACETATE 150 MG/ML IM SUSP
150.0000 mg | INTRAMUSCULAR | Status: AC
  Administered 2017-10-29: 150 mg via INTRAMUSCULAR

## 2017-10-29 NOTE — Progress Notes (Signed)
2/14/20193:47 PM  Lisa GandyMaria Mooney 1985/12/29, 32 y.o. female [id number]  Chief Complaint  Patient presents with  . Generalized Body Aches    flu like symptoms, Also wanting to get bc shot if possible    HPI:   Patient is a 32 y.o. female who presents today with 2 concerns:  Flu like symptoms that started 4 days ago, last fever 2 days ago. Cough improving. Still having body aches and headaches. Yesterday was in so much pain that it made her cry. Has not really taken anything for this. Did not have flu vaccine this season.   She would also like to restart depo Has done well on it in the past LMP 10/10/17 Regular menses Currently not sexually active  [facility] 10/29/2017 01/13/2017 09/18/2016  Decreased Interest 0 0 0  Down, Depressed, Hopeless 0 0 0  PHQ - 2 Score 0 0 0    No Known Allergies  Prior to Admission medications   Medication Sig Start Date End Date Taking? Authorizing Provider  medroxyPROGESTERone (DEPO-PROVERA) 150 MG/ML injection Inject 150 mg into the muscle every 3 (three) months.    [provider]    History reviewed. No pertinent past medical history.  History reviewed. No pertinent surgical history.  Social History   Tobacco Use  . Smoking status: Never Smoker  . Smokeless tobacco: Never Used  Substance Use Topics  . Alcohol use: No    Family History  Problem Relation Age of Onset  . Healthy Mother   . Healthy Father     ROS Per hpi  OBJECTIVE:  Blood pressure 108/76, pulse 97, temperature 98.5 F (36.9 C), temperature source Oral, height 5\' 8"  (1.727 m), weight 147 lb (66.7 kg), SpO2 97 %.  Physical Exam  Constitutional: She is oriented to person, place, and time and well-developed, well-nourished, and in no distress.  HENT:  Head: Normocephalic and atraumatic.  Mouth/Throat: Oropharynx is clear and moist. No oropharyngeal exudate.  Eyes: EOM are normal. Pupils are equal, round, and reactive to light.  No scleral icterus.  Neck: Neck supple.  Cardiovascular: Normal rate, regular rhythm and normal heart sounds. Exam reveals no gallop and no friction rub.  No murmur heard. Pulmonary/Chest: Effort normal and breath sounds normal. She has no wheezes. She has no rales.  Musculoskeletal: She exhibits no edema.  Neurological: She is alert and oriented to person, place, and time. Gait normal.  Skin: Skin is warm and dry.     Results for orders placed or performed in visit on 10/29/17 (from the past 24 hour(s))  POC Influenza A&B(BINAX/QUICKVUE)     Status: Abnormal   Collection Time: 10/29/17  3:59 PM  Result Value Ref Range   Influenza A, POC Positive (A) Negative   Influenza B, POC Negative Negative  POCT urine pregnancy     Status: None   Collection Time: 10/29/17  4:04 PM  Result Value Ref Range   Preg Test, Ur Negative Negative     ASSESSMENT and PLAN  1. Influenza A Discussed supportive measures: increase hydration, rest, OTC medications, etc. RTC precautions discussed.  2. Flu-like symptoms - POC Influenza A&B(BINAX/QUICKVUE)  3. Encounter for initial prescription of injectable contraceptive Next depo due may 2-16, nurse visit okay - POCT urine pregnancy - medroxyPROGESTERone (DEPO-PROVERA) injection 150 mg  Other orders - ibuprofen (ADVIL,MOTRIN) 600 MG tablet; Take 1 tablet (600 mg total) by mouth every 8 (eight) hours as needed.  Return in about 3 months (around 01/26/2018) for  depo - nurse visit.    Myles Lipps, MD Primary Care at [facility] [address] [place], [place] [address] Ph.  [phone] Fax [phone]

## 2017-10-29 NOTE — Patient Instructions (Addendum)
Next depo may 2-16, nurse visit    IF you received an x-ray today, you will receive an invoice from [facility]. Please contact [facility] at [phone] with questions or concerns regarding your invoice.   IF you received labwork today, you will receive an invoice from [facility]. Please contact [facility] at [phone] with questions or concerns regarding your invoice.   Our billing staff will not be able to assist you with questions regarding bills from these companies.  You will be contacted with the lab results as soon as they are available. The fastest way to get your results is to activate your My Chart account. Instructions are located on the last page of this paperwork. If you have not heard from us regarding the results in 2 weeks, please contact this office.     Gripe en los adultos (Influenza, Adult) La gripe es una infeccin en los pulmones, la nariz y la garganta (vas respiratorias). La causa un virus. La gripe provoca muchos sntomas del resfro comn, as como fiebre alta y Tourist information centre managerdolor corporal. Puede hacer que se sienta muy mal. Se transmite fcilmente de persona a persona (es contagiosa). La mejor manera de prevenir la gripe es aplicndose la vacuna contra la gripe todos los aos. CUIDADOS EN EL HOGAR  Tome los medicamentos de venta libre y los recetados solamente como se lo haya indicado el mdico.  Use un humidificador de aire fro para que el aire de su casa est ms hmedo. Esto puede facilitar la respiracin.  Descanse todo lo que sea necesario.  Beba suficiente lquido para mantener el pis (orina) claro o de color amarillo plido.  Al toser o estornudar, cbrase la boca y la Evansvillenariz.  Lvese las manos con agua y jabn frecuentemente, en especial despus de toser o Engineering geologistestornudar. Use un desinfectante para manos si no dispone de [place] y [place]jabn.  Lanny HurstQudese en su casa y no concurra al Aleen Campitrabajo o a la escuela, como se lo haya indicado el mdico. A menos que deba  ir al American Expressmdico, evite salir de su casa hasta que no tenga fiebre durante 24horas sin el uso de medicamentos.  Concurra a todas las visitas de control como se lo haya indicado el mdico. Esto es importante.  PREVENCIN  Aplicarse la vacuna anual contra la gripe es la mejor manera de evitar contagiarse la gripe. Puede aplicarse la vacuna contra la gripe a fines de verano, en otoo o en invierno. Pregntele al mdico cundo debe aplicarse la vacuna contra la gripe.  Lvese las manos o use un desinfectante de manos con frecuencia.  Evite el contacto con personas que estn enfermas durante la temporada de resfro y gripe.  Consuma alimentos saludables.  Beba abundantes lquidos.  Duerma lo suficiente.  Haga ejercicios regularmente.  SOLICITE AYUDA SI:  Tiene sntomas nuevos.  Tiene los siguientes sntomas: ? Journalist, newspaperDolor en el pecho. ? Deposiciones lquidas (diarrea). ? Fiebre.  La tos empeora.  Empieza a tener ms mucosidad.  Siente malestar estomacal (nuseas).  Vomita.  SOLICITE AYUDA DE INMEDIATO SI:  Siente que le falta el aire o tiene dificultad para Industrial/product designerrespirar.  La piel o las uas se tornan de un color [place] Hillsazulado.  Presenta un dolor intenso o rigidez en el cuello.  Siente dolor de cabeza de forma repentina.  Le duele la cara o el odo de forma repentina.  No puede detener los vmitos.  Esta informacin no tiene Theme park managercomo fin reemplazar el consejo del mdico. Asegrese de hacerle al mdico cualquier pregunta que tenga.  Document Released: 11/28/2008 Document Revised: 12/24/2015 Document Reviewed: 06/26/2015 Elsevier Interactive Patient Education  2017 [employer].

## 2017-11-09 ENCOUNTER — Encounter: Payer: Self-pay | Admitting: Physician Assistant

## 2017-11-09 ENCOUNTER — Ambulatory Visit: Payer: Managed Care, Other (non HMO) | Admitting: Physician Assistant

## 2017-11-09 ENCOUNTER — Other Ambulatory Visit: Payer: Self-pay

## 2017-11-09 VITALS — BP 98/66 | HR 79 | Temp 98.5°F | Resp 16 | Ht 68.0 in | Wt 145.6 lb

## 2017-11-09 DIAGNOSIS — M545 Low back pain, unspecified: Secondary | ICD-10-CM

## 2017-11-09 MED ORDER — MELOXICAM 15 MG PO TABS: 15.0000 mg | ORAL_TABLET | Freq: Every day | ORAL | 0 refills | Status: DC

## 2017-11-09 MED ORDER — CYCLOBENZAPRINE HCL 10 MG PO TABS: 5.0000 mg | ORAL_TABLET | Freq: Every day | ORAL | 0 refills | Status: DC

## 2017-11-09 NOTE — Patient Instructions (Signed)
Use a heating pad on your back and hip.  Take the medications as prescribed.  Come back in about 2 weeks if your still in pain.

## 2017-11-09 NOTE — Progress Notes (Signed)
11/09/2017 4:48 PM   DOB: Jun 02, 1986 / MRN: [id number]  SUBJECTIVE:  Lisa Mooney is a 32 y.o. female presenting for left posterior buttock pain that radiates down the posterior leg.  Symptoms have been present now for about 2 weeks.  Is not getting worse or better.  She is tried taking ibuprofen 200 mg in the morning at night which has helped some.  She is done this for 4 consecutive days.  She is not sexually active at this time.  Chart shows that she is taking birth control via injection.  She has No Known Allergies.   She  has no past medical history on file.    She  reports that  has never smoked. she has never used smokeless tobacco. She reports that she does not drink alcohol or use drugs. She  reports that she does not engage in sexual activity. The patient  has no past surgical history on file.  Her family history includes Healthy in her father and mother.  Review of Systems  Constitutional: Negative for chills and fever.  Respiratory: Negative for shortness of breath.   Cardiovascular: Negative for leg swelling.  Gastrointestinal: Negative for nausea.  Musculoskeletal: Positive for back pain and myalgias.  Skin: Negative for itching and rash.  Neurological: Negative for dizziness and focal weakness.    The problem list and medications were reviewed and updated by myself where necessary and exist elsewhere in the encounter.   OBJECTIVE:  BP 98/66 (BP Location: Right Arm, Patient Position: Sitting, Cuff Size: Normal)   Pulse 79   Temp 98.5 F (36.9 C) (Oral)   Resp 16   Ht 5\' 8"  (1.727 m)   Wt 145 lb 9.6 oz (66 kg)   SpO2 99%   BMI 22.14 kg/m   Physical Exam  Constitutional: She is oriented to person, place, and time. She is active.  Non-toxic appearance.  Eyes: EOM are normal. Pupils are equal, round, and reactive to light.  Cardiovascular: Normal rate, regular rhythm, S1 normal, S2 normal, normal heart sounds and intact distal pulses. Exam reveals no  gallop, no friction rub and no decreased pulses.  No murmur heard. Pulmonary/Chest: Effort normal. No stridor. No tachypnea. No respiratory distress. She has no wheezes. She has no rales.  Abdominal: She exhibits no distension.  Musculoskeletal: She exhibits no edema.  Neurological: She is alert and oriented to person, place, and time. She has normal strength and normal reflexes. She is not disoriented. She displays no atrophy and normal reflexes. No cranial nerve deficit or sensory deficit. She exhibits normal muscle tone. Coordination and gait normal.  Skin: Skin is warm and dry. She is not diaphoretic. No pallor.  Psychiatric: Her behavior is normal.    No results found for this or any previous visit (from the past 72 hour(s)).  No results found.  ASSESSMENT AND PLAN:  Lisa Mooney was seen today for leg pain.  Diagnoses and all orders for this visit:  Acute left-sided low back pain without sciatica: She has some tenderness over the sciatic notch as well as the left-sided lumbosacral paraspinals.  I think she is simply under treated herself with ibuprofen at this point.  We will place her on a two-week regimen of meloxicam and Flexeril.  I will see her back after trial of medications and if her pain persist would likely image her low back and consider a steroid. -     meloxicam (MOBIC) 15 MG tablet; Take 1 tablet (15 mg total) by  mouth daily. -     cyclobenzaprine (FLEXERIL) 10 MG tablet; Take 0.5-1 tablets (5-10 mg total) by mouth at bedtime. Do not mix with narcotics. May cause drowsiness.    The patient is advised to call or return to clinic if she does not see an improvement in symptoms, or to seek the care of the closest emergency department if she worsens with the above plan.   Deliah Boston, MHS, PA-C Primary Care at [facility] 11/09/2017 4:48 PM

## 2017-12-01 ENCOUNTER — Ambulatory Visit (INDEPENDENT_AMBULATORY_CARE_PROVIDER_SITE_OTHER): Payer: Managed Care, Other (non HMO)

## 2017-12-01 ENCOUNTER — Other Ambulatory Visit: Payer: Self-pay

## 2017-12-01 ENCOUNTER — Encounter: Payer: Self-pay | Admitting: Family Medicine

## 2017-12-01 ENCOUNTER — Ambulatory Visit: Payer: Managed Care, Other (non HMO) | Admitting: Family Medicine

## 2017-12-01 VITALS — BP 108/74 | HR 100 | Temp 98.3°F | Ht 68.0 in | Wt 144.8 lb

## 2017-12-01 DIAGNOSIS — G5702 Lesion of sciatic nerve, left lower limb: Secondary | ICD-10-CM

## 2017-12-01 DIAGNOSIS — M545 Low back pain, unspecified: Secondary | ICD-10-CM

## 2017-12-01 MED ORDER — MELOXICAM 15 MG PO TABS: 15.0000 mg | ORAL_TABLET | Freq: Every day | ORAL | 0 refills | Status: DC

## 2017-12-01 MED ORDER — GABAPENTIN 300 MG PO CAPS: 300.0000 mg | ORAL_CAPSULE | Freq: Every day | ORAL | 0 refills | Status: DC

## 2017-12-01 NOTE — Patient Instructions (Addendum)
IF you received an x-ray today, you will receive an invoice from [facility]. Please contact [facility] at [phone] with questions or concerns regarding your invoice.   IF you received labwork today, you will receive an invoice from [facility]. Please contact [facility] at [phone] with questions or concerns regarding your invoice.   Our billing staff will not be able to assist you with questions regarding bills from these companies.  You will be contacted with the lab results as soon as they are available. The fastest way to get your results is to activate your My Chart account. Instructions are located on the last page of this paperwork. If you have not heard from [place] regarding the results in 2 weeks, please contact this office.     Piriformis Syndrome With Rehab Piriformis Syndrome Rehab Consulte al mdico qu ejercicios son seguros para usted. Haga los ejercicios exactamente como se lo haya indicado el mdico y gradelos como se lo hayan indicado. Es normal sentir un leve estiramiento, tirn, rigidez o molestia cuando haga estos ejercicios, pero debe detenerse de inmediato si siento un dolor repentino o si el dolor empeora. No comience a hacer estos ejercicios hasta que se lo indique el mdico. EJERCICIOS DE Pilgrim's Pride Y AMPLITUD DE MOVIMIENTOS Estos ejercicios calientan los msculos y las articulaciones, y mejoran el movimiento y la flexibilidad de la cadera y la pelvis. Estos ejercicios tambin ayudan a Engineer, materials, el adormecimiento y el hormigueo. Ejercicio A: Rotadores de la cadera 1. Acustese boca arriba sobre una superficie firme. 2. Llvese la rodilla izquierda/derecha hacia el hombro del mismo lado con la mano izquierda/derecha hasta que la rodilla apunte al techo. Con la otra mano, tmese el tobillo izquierdo/derecho. 3. Con la rodilla firme, lleve suavemente el tobillo izquierdo/derecho hacia el otro hombro hasta sentir el estiramiento en las  nalgas. 4. Mantenga esta posicin durante __________ segundos.  Repita __________ veces. Realice este estiramiento __________ veces al da. Ejercicio B: Extensores de cadera 1. Acustese boca arriba sobre una superficie firme. Debe tener ambas piernas extendidas. 2. Llvese la rodilla izquierda/derecha al pecho. Mantenga la pierna en esta posicin; para ello, tmese de la parte posterior del muslo o de la parte delantera de la rodilla. 3. Mantenga esta posicin durante __________ segundos. 4. Vuelva lentamente a la posicin inicial.  Repita __________ veces. Realice este estiramiento __________ veces al da. EJERCICIOS DE FORTALECIMIENTO Estos ejercicios fortalecen la cadera y los muslos, y les otorgan resistencia. La resistencia es la capacidad de usar los msculos durante un tiempo prolongado, incluso despus de que se cansen. Ejercicio C: Elevaciones de la pierna extendida, msculos abductores de la cadera 1. Recustese de costado con la pierna izquierda/derecha arriba. Recustese de [employer]	[place] cabeza, los hombros, la rodilla y la cadera estn alineados. Flexione la rodilla de abajo para mantener el equilibrio. 2. Eleve la pierna de arriba unas 4 a 6 pulgadas (10 a 15 cm), con los dedos de los pies apuntando hacia adelante. 3. Mantenga esta posicin durante __________ segundos. 4. Baje lentamente la pierna hasta la posicin inicial. Relaje los msculos completamente.  Repita __________ veces. Realice este ejercicio __________ veces al da. Ejercicio D: Abductores y rotadores de la cadera, posicin cuadrpeda 1. Apoye las manos y las rodillas sobre una superficie firme y Tree surgeon. Las manos deben estar justo debajo de los hombros, y las rodillas debajo de la cadera. 2. Phylliss Bob la rodilla izquierda/derecha hacia el costado. Mantenga la rodilla flexionada. No gire el cuerpo. 3.  Mantenga esta posicin durante __________ segundos. 4. Lentamente, baje la pierna.  Repita __________  veces. Realice este ejercicio __________ veces al da. Ejercicio E: Elevaciones de la pierna extendida, extensores de la cadera 1. Acustese boca abajo en una cama o una superficie firme con una almohada debajo de las caderas. 2. Contraiga los msculos de las nalgas y eleve el muslo izquierdo/derecho hasta separarlo de la cama. No deje que la espalda se arquee. 3. Mantenga esta posicin durante __________ segundos. 4. Vuelva lentamente a la posicin inicial. Deje que los msculos se relajen completamente antes de hacer otra repeticin.  Repita __________ veces. Realice este ejercicio __________ veces al da. Esta informacin no tiene Theme park managercomo fin reemplazar el consejo del mdico. Asegrese de hacerle al mdico cualquier pregunta que tenga. Document Released: 06/18/2006 Document Revised: 01/16/2015 Document Reviewed: 08/14/2015 Elsevier Interactive Patient Education  [employer]2018 Elsevier Inc.

## 2017-12-01 NOTE — Progress Notes (Signed)
3/19/20194:24 PM  Lisa GandyMaria Mooney 10/26/1985, 32 y.o. female [id number]  Chief Complaint  Patient presents with  . Leg Pain    has been having left leg pain, causing extreme discomfort and cannot sleep due to pain    HPI:   Patient is a 32 y.o. female who presents today for about a month of left sided low back/buttock pain that radiates down her left leg into her calf. She describes pain as burning, pins and needles. Denies any weakness. Denies any inciting event. Was seen about 2 weeks ago, prescribed meloxicam and flexeril. Helped for about 4 days and then not so much. States pain significantly escalates at night, makes her cry, has not been sleeping well past couple of days.  Denies any h/o previous back issues. Denies any changes to bowel or bladder.  [facility] 12/01/2017 11/09/2017 10/29/2017  Decreased Interest 0 0 0  Down, Depressed, Hopeless 0 0 0  PHQ - 2 Score 0 0 0    No Known Allergies  Prior to Admission medications   Medication Sig Start Date End Date Taking? Authorizing Provider  cyclobenzaprine (FLEXERIL) 10 MG tablet Take 0.5-1 tablets (5-10 mg total) by mouth at bedtime. Do not mix with narcotics. May cause drowsiness. 11/09/17  Yes Ofilia Neaslark, Michael L, PA-C  ibuprofen (ADVIL,MOTRIN) 600 MG tablet Take 1 tablet (600 mg total) by mouth every 8 (eight) hours as needed. 10/29/17  Yes Myles LippsSantiago, Promise Bushong M, MD  meloxicam (MOBIC) 15 MG tablet Take 1 tablet (15 mg total) by mouth daily. 11/09/17  Yes Ofilia Neaslark, Michael L, PA-C    History reviewed. No pertinent past medical history.  History reviewed. No pertinent surgical history.  Social History   Tobacco Use  . Smoking status: Never Smoker  . Smokeless tobacco: Never Used  Substance Use Topics  . Alcohol use: No    Family History  Problem Relation Age of Onset  . Healthy Mother   . Healthy Father     ROS Per hpi  OBJECTIVE:  Blood pressure 108/74, pulse 100, temperature 98.3 F (36.8 C),  temperature source Oral, height 5\' 8"  (1.727 m), weight 144 lb 12.8 oz (65.7 kg), SpO2 100 %.  Physical Exam  Constitutional: She is oriented to person, place, and time and well-developed, well-nourished, and in no distress.  Musculoskeletal:       Lumbar back: She exhibits tenderness (most TTP place is over left piriformis. ) and spasm (left lumbar paraspinal). She exhibits no bony tenderness.  Neurological: She is alert and oriented to person, place, and time. She has normal strength and normal reflexes. She has a normal Straight Leg Raise Test. Gait normal.    Dg Lumbar Spine 2-3 Views  Result Date: 12/01/2017 CLINICAL DATA:  Lumbago EXAM: LUMBAR SPINE - 2-3 VIEW COMPARISON:  None. FINDINGS: Frontal, lateral, and spot lumbosacral lateral images were obtained. The there are 5 non-rib-bearing lumbar type vertebral bodies. There is no fracture or spondylolisthesis. The disc spaces appear normal. No erosive change. IMPRESSION: No fracture or spondylolisthesis.  No evident arthropathy. Electronically Signed   By: Bretta BangWilliam  Woodruff III M.D.   On: 12/01/2017 16:41     ASSESSMENT and PLAN  1. Piriformis syndrome of left side 2. Acute left-sided low back pain without sciatica Discussed supportive measures, new meds r/se/b and RTC precautions. Patient educational handout given. - DG Lumbar Spine 2-3 Views; Future - meloxicam (MOBIC) 15 MG tablet; Take 1 tablet (15 mg total) by mouth daily.  Other orders - gabapentin (  NEURONTIN) 300 MG capsule; Take 1-2 capsules (300-600 mg total) by mouth at bedtime.  Return in about 2 weeks (around 12/15/2017).    Myles Lipps, MD Primary Care at [facility] [address] [place], [place] [address] Ph.  [phone] Fax [phone]

## 2017-12-08 ENCOUNTER — Other Ambulatory Visit: Payer: Self-pay

## 2017-12-08 ENCOUNTER — Other Ambulatory Visit: Payer: Self-pay | Admitting: Physician Assistant

## 2017-12-08 DIAGNOSIS — M545 Low back pain, unspecified: Secondary | ICD-10-CM

## 2017-12-08 NOTE — Telephone Encounter (Signed)
Patient is requesting a refill of the following medications: Requested Prescriptions   Pending Prescriptions Disp Refills  . cyclobenzaprine (FLEXERIL) 10 MG tablet [Pharmacy Med Name: CYCLOBENZAPRINE 10 MG TABLET] 14 tablet 0    Sig: TAKE 1/2 TABLET POR VIA ORAL AL ACOSTARSE *DON'T MIX WITH NARCOTICS*    Date of patient request: 12/08/17  Last office visit: 12/04/17 Date of last refill: 10/29/17 Last refill amount: 14 Follow up time period per chart: 12/15/17

## 2017-12-08 NOTE — Telephone Encounter (Signed)
Last seen on 3/19 not 3/22

## 2018-01-15 ENCOUNTER — Other Ambulatory Visit: Payer: Self-pay

## 2018-01-15 ENCOUNTER — Encounter: Payer: Self-pay | Admitting: Family Medicine

## 2018-01-15 ENCOUNTER — Ambulatory Visit (INDEPENDENT_AMBULATORY_CARE_PROVIDER_SITE_OTHER): Payer: Managed Care, Other (non HMO) | Admitting: Family Medicine

## 2018-01-15 VITALS — BP 110/68 | HR 100 | Temp 101.2°F | Ht 68.0 in | Wt 145.0 lb

## 2018-01-15 DIAGNOSIS — R509 Fever, unspecified: Secondary | ICD-10-CM

## 2018-01-15 DIAGNOSIS — R1030 Lower abdominal pain, unspecified: Secondary | ICD-10-CM | POA: Diagnosis not present

## 2018-01-15 LAB — POCT URINALYSIS DIP (MANUAL ENTRY)
Bilirubin, UA: NEGATIVE
Glucose, UA: NEGATIVE mg/dL
Leukocytes, UA: NEGATIVE
Nitrite, UA: NEGATIVE
Spec Grav, UA: 1.02 (ref 1.010–1.025)
Urobilinogen, UA: 0.2 E.U./dL
pH, UA: 6.5 (ref 5.0–8.0)

## 2018-01-15 LAB — POCT CBC
Granulocyte percent: 85.4 %G — AB (ref 37–80)
HCT, POC: 35.8 % — AB (ref 37.7–47.9)
Hemoglobin: 12 g/dL — AB (ref 12.2–16.2)
Lymph, poc: 0.8 (ref 0.6–3.4)
MCH, POC: 29.5 pg (ref 27–31.2)
MCHC: 33.7 g/dL (ref 31.8–35.4)
MCV: 87.8 fL (ref 80–97)
MID (cbc): 0.4 (ref 0–0.9)
MPV: 6.3 fL (ref 0–99.8)
POC Granulocyte: 7.3 — AB (ref 2–6.9)
POC LYMPH PERCENT: 9.7 %L — AB (ref 10–50)
POC MID %: 4.9 %M (ref 0–12)
Platelet Count, POC: 243 10*3/uL (ref 142–424)
RBC: 4.08 M/uL (ref 4.04–5.48)
WBC: 8.5 10*3/uL (ref 4.6–10.2)

## 2018-01-15 LAB — POC MICROSCOPIC URINALYSIS (UMFC)

## 2018-01-15 LAB — COMPREHENSIVE METABOLIC PANEL
ALT: 16 IU/L (ref 0–32)
AST: 18 IU/L (ref 0–40)
Albumin/Globulin Ratio: 1.3 (ref 1.2–2.2)
Albumin: 3.9 g/dL (ref 3.5–5.5)
Alkaline Phosphatase: 68 IU/L (ref 39–117)
BUN/Creatinine Ratio: 13 (ref 9–23)
BUN: 8 mg/dL (ref 6–20)
Bilirubin Total: 0.3 mg/dL (ref 0.0–1.2)
CO2: 20 mmol/L (ref 20–29)
Calcium: 8.7 mg/dL (ref 8.7–10.2)
Chloride: 105 mmol/L (ref 96–106)
Creatinine, Ser: 0.6 mg/dL (ref 0.57–1.00)
GFR calc Af Amer: 139 mL/min/{1.73_m2} (ref >59)
GFR calc non Af Amer: 121 mL/min/{1.73_m2} (ref >59)
Globulin, Total: 2.9 g/dL (ref 1.5–4.5)
Glucose: 80 mg/dL (ref 65–99)
Potassium: 3.7 mmol/L (ref 3.5–5.2)
Sodium: 138 mmol/L (ref 134–144)
Total Protein: 6.8 g/dL (ref 6.0–8.5)

## 2018-01-15 LAB — POC INFLUENZA A&B (BINAX/QUICKVUE)
Influenza A, POC: NEGATIVE
Influenza B, POC: NEGATIVE

## 2018-01-15 LAB — POCT URINE PREGNANCY: Preg Test, Ur: NEGATIVE

## 2018-01-15 MED ORDER — LEVOFLOXACIN 750 MG PO TABS: 750.0000 mg | ORAL_TABLET | Freq: Every day | ORAL | 0 refills | Status: AC

## 2018-01-15 MED ORDER — METRONIDAZOLE 500 MG PO TABS: 500.0000 mg | ORAL_TABLET | Freq: Three times a day (TID) | ORAL | 0 refills | Status: DC

## 2018-01-15 MED ORDER — IBUPROFEN 600 MG PO TABS: 600.0000 mg | ORAL_TABLET | Freq: Four times a day (QID) | ORAL | 0 refills | Status: AC | PRN

## 2018-01-15 MED ORDER — ACETAMINOPHEN 500 MG PO TABS: 500.0000 mg | ORAL_TABLET | Freq: Four times a day (QID) | ORAL | 0 refills | Status: AC | PRN

## 2018-01-15 NOTE — Patient Instructions (Signed)
     IF you received an x-ray today, you will receive an invoice from [facility]. Please contact [facility] at [phone] with questions or concerns regarding your invoice.   IF you received labwork today, you will receive an invoice from [facility]. Please contact [facility] at [phone] with questions or concerns regarding your invoice.   Our billing staff will not be able to assist you with questions regarding bills from these companies.  You will be contacted with the lab results as soon as they are available. The fastest way to get your results is to activate your My Chart account. Instructions are located on the last page of this paperwork. If you have not heard from us regarding the results in 2 weeks, please contact this office.     

## 2018-01-15 NOTE — Progress Notes (Signed)
5/3/201912:00 PM  Lisa Mooney 1986/02/23, 32 y.o. female [id number]  Chief Complaint  Patient presents with  . Fever    flu like symptoms    HPI:   Patient is a 32 y.o. female  who presents today for fever, chills and general body aches that started last night. She denies any runny nose, nasal congestion, ear pain, sore throat, cough, sob She denies any abd pain,vomiting, constipation. has some mild nausea and loose stools. She denies any blood or black tarry stools. She denies any flank pain, dysuria, hematuria She denies any vaginal discharge, pelvic pain She denies any sick contacts  She has been taking ibuprofen , last dose this morning  Fall Risk  01/15/2018 12/01/2017 11/09/2017 10/29/2017 01/13/2017  Falls in the past year? No No No No No     Depression screen [facility] 2/9 01/15/2018 12/01/2017 11/09/2017  Decreased Interest 0 0 0  Down, Depressed, Hopeless 0 0 0  PHQ - 2 Score 0 0 0    No Known Allergies  Prior to Admission medications   Not on File    History reviewed. No pertinent past medical history.  History reviewed. No pertinent surgical history.  Social History   Tobacco Use  . Smoking status: Never Smoker  . Smokeless tobacco: Never Used  Substance Use Topics  . Alcohol use: No    Family History  Problem Relation Age of Onset  . Healthy Mother   . Healthy Father     ROS Per hpi  OBJECTIVE:  Blood pressure 110/68, pulse 100, temperature (!) 101.2 F (38.4 C), temperature source Oral, height  (1.727 m), weight 145 lb (65.8 kg), last menstrual period 01/14/2018, SpO2 98 %.  Physical Exam  Constitutional: She is oriented to person, place, and time. She appears well-developed and well-nourished.  Non-toxic appearance.  HENT:  Head: Normocephalic and atraumatic.  Right Ear: Hearing, tympanic membrane, external ear and ear canal normal.  Left Ear: Hearing, tympanic membrane, external ear and ear canal normal.  Mouth/Throat:  Oropharynx is clear and moist.  Eyes: Pupils are equal, round, and reactive to light. Conjunctivae and EOM are normal.  Neck: Neck supple. No thyromegaly present.  Cardiovascular: Normal rate, regular rhythm, normal heart sounds and intact distal pulses. Exam reveals no gallop and no friction rub.  No murmur heard. Pulmonary/Chest: Effort normal and breath sounds normal. She has no wheezes. She has no rales.  Abdominal: Soft. Bowel sounds are normal. She exhibits no distension and no mass. There is tenderness (across lower abd but most prominent over LLQ). There is no rebound, no guarding and no CVA tenderness.  Genitourinary: There is no rash or lesion on the right labia. There is no rash or lesion on the left labia. Uterus is not enlarged, not fixed and not tender. Cervix exhibits no motion tenderness, no discharge and no friability. Right adnexum displays no mass, no tenderness and no fullness. Left adnexum displays tenderness. Left adnexum displays no mass and no fullness. No erythema in the vagina. No vaginal discharge found.  Musculoskeletal: Normal range of motion. She exhibits no edema.  Lymphadenopathy:    She has no cervical adenopathy.  Neurological: She is alert and oriented to person, place, and time. She has normal reflexes. No cranial nerve deficit. Gait normal.  Skin: Skin is warm and dry.  Psychiatric: She has a normal mood and affect.  Nursing note and vitals reviewed.    Results for orders placed or performed in visit on 01/15/18 (from the  past 24 hour(s))  Result Value Ref Range  POCT urine pregnancy     Status: Normal   Collection Time: 01/15/18 12:22 PM  Result Value Ref Range   Preg Test, Ur Negative Negative  POCT urinalysis dipstick     Status: Abnormal   Collection Time: 01/15/18 12:22 PM  Result Value Ref Range   Color, UA yellow yellow   Clarity, UA clear clear   Glucose, UA negative negative mg/dL   Bilirubin, UA negative negative   Ketones, POC UA trace  (5) (A) negative mg/dL   Spec Grav, UA 1.610 9.604 - 1.025   Blood, UA trace-intact (A) negative   pH, UA 6.5 5.0 - 8.0   Protein Ur, POC trace (A) negative mg/dL   Urobilinogen, UA 0.2 0.2 or 1.0 E.U./dL   Nitrite, UA Negative Negative   Leukocytes, UA Negative Negative  POCT CBC     Status: Abnormal   Collection Time: 01/15/18 12:33 PM  Result Value Ref Range   WBC 8.5 4.6 - 10.2 K/uL   Lymph, poc 0.8 0.6 - 3.4   POC LYMPH PERCENT 9.7 (A) 10 - 50 %L   MID (cbc) 0.4 0 - 0.9   POC MID % 4.9 0 - 12 %M   POC Granulocyte 7.3 (A) 2 - 6.9   Granulocyte percent 85.4 (A) 37 - 80 %G   RBC 4.08 4.04 - 5.48 M/uL   Hemoglobin 12.0 (A) 12.2 - 16.2 g/dL   HCT, POC 54.0 (A) 98.1 - 47.9 %   MCV 87.8 80 - 97 fL   MCH, POC 29.5 27 - 31.2 pg   MCHC 33.7 31.8 - 35.4 g/dL   RDW, POC  %   Platelet Count, POC 243 142 - 424 K/uL   MPV 6.3 0 - 99.8 fL  POCT Microscopic Urinalysis ([facility])     Status: Abnormal   Collection Time: 01/15/18 12:38 PM  Result Value Ref Range   WBC,UR,HPF,POC Moderate (A) None WBC/hpf   RBC,UR,HPF,POC None None RBC/hpf   Bacteria Moderate (A) None, Too numerous to count   Mucus Present (A) Absent   Epithelial Cells, UR Per Microscopy Many (A) None, Too numerous to count cells/hpf     ASSESSMENT and PLAN  1. Fever, unspecified fever cause - POC Influenza A&B(BINAX/QUICKVUE) - POCT CBC - POCT urinalysis dipstick - POCT Microscopic Urinalysis ([facility]) - Comprehensive metabolic panel - Urine Culture  2. Lower abdominal pain - POCT CBC - POCT urine pregnancy - GC/Chlamydia Probe Amp([facility]) - WET PREP FOR TRICH, YEAST, CLUE - US PELVIC COMPLETE WITH TRANSVAGINAL; Future - Comprehensive metabolic panel  Patient febrile but not toxic appearing. Normal WBC but starting to see a left shift. History and exam significant for only LLQ pain. UA neg N/L, but bacteriua present. Pelvic exam wo cervicitis.  Most likely GI origin. Treating empirically for diverticulitis. Strict  ER precautions given.  Other orders - levofloxacin (LEVAQUIN) 750 MG tablet; Take 1 tablet (750 mg total) by mouth daily for 7 days. - metroNIDAZOLE (FLAGYL) 500 MG tablet; Take 1 tablet (500 mg total) by mouth 3 (three) times daily. - ibuprofen (ADVIL,MOTRIN) 600 MG tablet; Take 1 tablet (600 mg total) by mouth every 6 (six) hours as needed. - acetaminophen (TYLENOL) 500 MG tablet; Take 1 tablet (500 mg total) by mouth every 6 (six) hours as needed.  Return in about 4 days (around 01/19/2018).    Myles Lipps, MD Primary Care at [facility] [address] [place], [place] [address] Ph.  [phone] Fax [phone]

## 2018-01-16 LAB — URINE CULTURE: Organism ID, Bacteria: NO GROWTH

## 2018-01-16 LAB — WET PREP FOR TRICH, YEAST, CLUE
Clue Cell Exam: NEGATIVE
Trichomonas Exam: NEGATIVE
Yeast Exam: NEGATIVE

## 2018-01-18 LAB — GC/CHLAMYDIA PROBE AMP
Chlamydia trachomatis, NAA: NEGATIVE
Neisseria gonorrhoeae by PCR: NEGATIVE

## 2018-04-05 ENCOUNTER — Ambulatory Visit: Payer: Managed Care, Other (non HMO)

## 2018-06-10 ENCOUNTER — Telehealth: Payer: Self-pay | Admitting: Family Medicine

## 2018-06-10 NOTE — Telephone Encounter (Signed)
Message sent to Dr. Leretha Pol. - Pt only wants to speak to her.

## 2018-06-10 NOTE — Telephone Encounter (Signed)
Copied from CRM [id number]. Topic: General - Other >> Jun 10, 2018 10:07 AM Gerrianne Scale wrote: Reason for CRM: pt states that she would like to speak with Dr Leretha Pol she states that she wouldn't be able to tell me she only wants to speak to Dr

## 2018-06-11 NOTE — Telephone Encounter (Signed)
Patient last seen in May for acute concerns She needs to make an appointment to see me, please schedule. thanks

## 2018-06-16 ENCOUNTER — Telehealth: Payer: Self-pay | Admitting: Family Medicine

## 2018-06-16 NOTE — Telephone Encounter (Signed)
Called Lisa Mooney to let her know that she will need to make an appt with Dr. Leretha Pol in order to talk to her. She will call in on Tuesday next week in order to make a same day appt on Wednesday.

## 2018-06-21 ENCOUNTER — Ambulatory Visit (INDEPENDENT_AMBULATORY_CARE_PROVIDER_SITE_OTHER): Payer: Managed Care, Other (non HMO) | Admitting: Family Medicine

## 2018-06-21 ENCOUNTER — Encounter: Payer: Self-pay | Admitting: Family Medicine

## 2018-06-21 ENCOUNTER — Other Ambulatory Visit: Payer: Self-pay

## 2018-06-21 VITALS — BP 114/80 | HR 79 | Temp 98.6°F | Ht 68.0 in | Wt 148.0 lb

## 2018-06-21 DIAGNOSIS — R002 Palpitations: Secondary | ICD-10-CM

## 2018-06-21 DIAGNOSIS — Z23 Encounter for immunization: Secondary | ICD-10-CM | POA: Diagnosis not present

## 2018-06-21 DIAGNOSIS — G4700 Insomnia, unspecified: Secondary | ICD-10-CM | POA: Diagnosis not present

## 2018-06-21 DIAGNOSIS — E162 Hypoglycemia, unspecified: Secondary | ICD-10-CM | POA: Diagnosis not present

## 2018-06-21 DIAGNOSIS — R945 Abnormal results of liver function studies: Secondary | ICD-10-CM

## 2018-06-21 DIAGNOSIS — Z8349 Family history of other endocrine, nutritional and metabolic diseases: Secondary | ICD-10-CM

## 2018-06-21 DIAGNOSIS — R7989 Other specified abnormal findings of blood chemistry: Secondary | ICD-10-CM

## 2018-06-21 DIAGNOSIS — F411 Generalized anxiety disorder: Secondary | ICD-10-CM

## 2018-06-21 DIAGNOSIS — F439 Reaction to severe stress, unspecified: Secondary | ICD-10-CM

## 2018-06-21 MED ORDER — HYDROXYZINE HCL 25 MG PO TABS: 12.5000 mg | ORAL_TABLET | Freq: Four times a day (QID) | ORAL | 0 refills | Status: AC | PRN

## 2018-06-21 NOTE — Patient Instructions (Addendum)
I will check some blood work for your symptoms, as well as recheck of possible abnormal testing from your work physical.  However I think some your symptoms may be due to stress or adjustment disorder.  See information below on these conditions.    Work on some form of relaxation technique each day and especially when you are stressed.  Exercise in some form, even going for a walk every day may also be helpful.  For sleep, can try hydroxyzine 1/2 to 1 pill at bedtime.  Other relaxation techniques before going to bed such as music, warm bath, warm tea may also be helpful.  Dry mouth may have been due to the dose of the other sleep medicine you were taking, but this medication can also cause similar symptoms.  Follow-up in 1 week with Dr. Pamella Pert or I can see you in follow-up as well if needed.  Please follow-up sooner if your symptoms worsen.   Adjustment Disorder, Adult Adjustment disorder is a group of symptoms that can develop after a stressful life event, such as the loss of a job or serious physical illness. The symptoms can affect how you feel, think, and act. They may interfere with your relationships. Adjustment disorder increases your risk of suicide and substance abuse. If this disorder is not managed early, it can develop into a more serious condition, such as major depressive disorder or post-traumatic stress disorder. What are the causes? This condition happens when you have trouble recovering from or coping with a stressful life event. What increases the risk? You are more likely to develop this condition if:  You have had depression or anxiety.  You are being treated for a long-term (chronic) illness.  You are being treated for an illness that cannot be cured (terminal illness).  You have a family history of mental illness.  What are the signs or symptoms? Symptoms of this condition include:  Extreme trouble doing daily tasks, such as going to work.  Sadness, depression,  or crying spells.  Worrying a lot.  Loss of enjoyment.  Change in appetite or weight.  Feelings of loss or hopelessness.  Thoughts of suicide.  Anxiety, worry, or nervousness.  Trouble sleeping.  Avoiding family and friends.  Fighting or vandalism.  Complaining of feeling sick without being ill.  Feeling dazed or disconnected.  Nightmares.  Trouble sleeping.  Irritability.  Reckless driving.  Poor work Systems analyst.  Ignoring bills.  Symptoms of this condition start within three months of the stressful event. They do not last more than six months, unless the stressful circumstances last longer. Normal grieving after the death of a loved one is not a symptom of this condition. How is this diagnosed? To diagnose this condition, your health care provider will ask about what has happened in your life and how it has affected you. He or she may also ask about your medical history and your use of medicines, alcohol, and other substances. Your health care provider may do a physical exam and order lab tests or other studies. You may be referred to a mental health specialist. How is this treated? Treatment options for this condition include:  Counseling or talk therapy. Talk therapy is usually provided by mental health specialists.  Medicines. Certain medicines may help with depression, anxiety, and sleep.  Support groups. These offer emotional support, advice, and guidance. They are made up of people who have had similar experiences.  Observation and time. This is sometimes called "watchful waiting." In this treatment, health  care providers monitor your health and behavior without other treatment. Adjustment disorder sometimes gets better on its own with time.  Follow these instructions at home:  Take over-the-counter and prescription medicines only as told by your health care provider.  Keep all follow-up visits as told by your health care provider. This is  important. Contact a health care provider if:  Your symptoms do not improve in six months.  Your symptoms get worse. Get help right away if:  You have serious thoughts about hurting yourself or someone else. If you ever feel like you may hurt yourself or others, or have thoughts about taking your own life, get help right away. You can go to your nearest emergency department or call:  Your local emergency services (911 in the [place]  A suicide crisis helpline, such as the [place] at (601)491-9766. This is open 24 hours a day.  Summary  Adjustment disorder is a group of symptoms that can develop after a stressful life event, such as the loss of a job or serious physical illness. The symptoms can affect how you feel, think, and act. They may interfere with your relationships.  Symptoms of this condition start within three months of the stressful event. They do not last more than six months, unless the stressful circumstances last longer.  Treatment may include talk therapy, medicines, participation in a support group, or observation to see if symptoms improve.  Contact your health care provider if your symptoms get worse or do not improve in six months.  If you ever feel like you may hurt yourself or others, or have thoughts about taking your own life, get help right away. This information is not intended to replace advice given to you by your health care provider. Make sure you discuss any questions you have with your health care provider. Document Released: 05/06/2006 Document Revised: 10/31/2016 Document Reviewed: 10/31/2016 Elsevier Interactive Patient Education  2018 [place] and Stress Management Stress is a normal reaction to life events. It is what you feel when life demands more than you are used to or more than you can handle. Some stress can be useful. For example, the stress reaction can help you catch the last bus of the day,  study for a test, or meet a deadline at work. But stress that occurs too often or for too long can cause problems. It can affect your emotional health and interfere with relationships and normal daily activities. Too much stress can weaken your immune system and increase your risk for physical illness. If you already have a medical problem, stress can make it worse. What are the causes? All sorts of life events may cause stress. An event that causes stress for one person may not be stressful for another person. Major life events commonly cause stress. These may be positive or negative. Examples include losing your job, moving into a new home, getting married, having a baby, or losing a loved one. Less obvious life events may also cause stress, especially if they occur day after day or in combination. Examples include working long hours, driving in traffic, caring for children, being in debt, or being in a difficult relationship. What are the signs or symptoms? Stress may cause emotional symptoms including, the following:  Anxiety. This is feeling worried, afraid, on edge, overwhelmed, or out of control.  Anger. This is feeling irritated or impatient.  Depression. This is feeling sad, down, helpless, or guilty.  Difficulty focusing, remembering,  or making decisions.  Stress may cause physical symptoms, including the following:  Aches and pains. These may affect your head, neck, back, stomach, or other areas of your body.  Tight muscles or clenched jaw.  Low energy or trouble sleeping.  Stress may cause unhealthy behaviors, including the following:  Eating to feel better (overeating) or skipping meals.  Sleeping too little, too much, or both.  Working too much or putting off tasks (procrastination).  Smoking, drinking alcohol, or using drugs to feel better.  How is this diagnosed? Stress is diagnosed through an assessment by your health care provider. Your health care provider will ask  questions about your symptoms and any stressful life events.Your health care provider will also ask about your medical history and may order blood tests or other tests. Certain medical conditions and medicine can cause physical symptoms similar to stress. Mental illness can cause emotional symptoms and unhealthy behaviors similar to stress. Your health care provider may refer you to a mental health professional for further evaluation. How is this treated? Stress management is the recommended treatment for stress.The goals of stress management are reducing stressful life events and coping with stress in healthy ways. Techniques for reducing stressful life events include the following:  Stress identification. Self-monitor for stress and identify what causes stress for you. These skills may help you to avoid some stressful events.  Time management. Set your priorities, keep a calendar of events, and learn to say "no." These tools can help you avoid making too many commitments.  Techniques for coping with stress include the following:  Rethinking the problem. Try to think realistically about stressful events rather than ignoring them or overreacting. Try to find the positives in a stressful situation rather than focusing on the negatives.  Exercise. Physical exercise can release both physical and emotional tension. The key is to find a form of exercise you enjoy and do it regularly.  Relaxation techniques. These relax the body and mind. Examples include yoga, meditation, tai chi, biofeedback, deep breathing, progressive muscle relaxation, listening to music, being out in nature, journaling, and other hobbies. Again, the key is to find one or more that you enjoy and can do regularly.  Healthy lifestyle. Eat a balanced diet, get plenty of sleep, and do not smoke. Avoid using alcohol or drugs to relax.  Strong support network. Spend time with family, friends, or other people you enjoy being  around.Express your feelings and talk things over with someone you trust.  Counseling or talktherapy with a mental health professional may be helpful if you are having difficulty managing stress on your own. Medicine is typically not recommended for the treatment of stress.Talk to your health care provider if you think you need medicine for symptoms of stress. Follow these instructions at home:  Keep all follow-up visits as directed by your health care provider.  Take all medicines as directed by your health care provider. Contact a health care provider if:  Your symptoms get worse or you start having new symptoms.  You feel overwhelmed by your problems and can no longer manage them on your own. Get help right away if:  You feel like hurting yourself or someone else. This information is not intended to replace advice given to you by your health care provider. Make sure you discuss any questions you have with your health care provider. Document Released: 02/25/2001 Document Revised: 02/07/2016 Document Reviewed: 04/26/2013 Elsevier Interactive Patient Education  2017 [employer].     If you  have lab work done today you will be contacted with your lab results within the next 2 weeks.  If you have not heard from [place] then please contact us. The fastest way to get your results is to register for My Chart.   IF you received an x-ray today, you will receive an invoice from [facility]. Please contact [facility] at [phone] with questions or concerns regarding your invoice.   IF you received labwork today, you will receive an invoice from [place] [facility]. Please contact [facility] at [phone] with questions or concerns regarding your invoice.   Our billing staff will not be able to assist you with questions regarding bills from these companies.  You will be contacted with the lab results as soon as they are available. The fastest way to get your results is to activate  your My Chart account. Instructions are located on the last page of this paperwork. If you have not heard from [place] regarding the results in 2 weeks, please contact this office.

## 2018-06-21 NOTE — Progress Notes (Signed)
Subjective:  By signing my name below, I, Moises Blood, attest that this documentation has been prepared under the direction and in the presence of Merri Ray, MD. Electronically Signed: Moises Blood, [place]. 06/21/2018 , 5:43 PM .  Patient was seen in Room 9 .   Patient ID: Lisa Mooney, female    DOB: 16-Jan-1986, 32 y.o.   MRN: [id number] Chief Complaint  Patient presents with  . Anxiety    having trouble sleeping, requesting medication for sleep. Says she wakes up in the middle of the night, shaking   HPI Lisa Mooney is a 32 y.o. female She is a new patient to me. It appears she was most recently under care of Dr. Pamella Pert for acute symptoms. She's here for above symptoms. She has received Depo Provera in the past, last received in February. She has a family history (in mother) of hypothyroidism; patient had normal TSH in May 2018. She works for a [employer].   Patient states she hasn't been able to sleep throughout the night, and wakes up around 2:00 AM feeling nervous with general body myalgias and her legs and lip dryness. She mentions having a physical done at her work about 1 month ago; was informed her blood sugar was low at 58. Her mother informed that her father had similar low blood sugars. She's been stressed with full time work, and full time school in the evening. She is also a single-mother with an 54 year old daughter. She's been doing this for 2 years now. She denies any changes in the past 2 weeks. She denies worrying during the day or when she lays down. She's been taking OTC sleep aid with benadryl 50 mg (2 tablets) each night without relief. She's feeling tired during the day due to lack of sleep. She's been feeling some heart palpitations. She denies drinking any caffeine. She was also informed having elevated liver tests on that physical 1 month ago. She had normal CMP in May. She denies any SI/HI. She denies being treated for  anxiety in the past. She denies any exercises. She denies being on birth control, and denies any unprotected intercourse recently. She mentions her periods have been normal. 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   Depression screen [facility] 2/9 06/21/2018 01/15/2018 12/01/2017 11/09/2017 10/29/2017  Decreased Interest 0 0 0 0 0  Down, Depressed, Hopeless 0 0 0 0 0  PHQ - 2 Score 0 0 0 0 0   Some lab results per her phone:  Glucose: 72 in 2017, 89 in 2018, and informed 58 1 month ago   She's from [place]. Her mother was present during the visit today.  She was informed to make an appointment with Dr. Pamella Pert tomorrow, but patient notes she really wanted some sleep tonight.   Patient Active Problem List   Diagnosis Date Noted  . Amenorrhea due to Depo Provera 01/13/2017  . Family history of hyperthyroidism 01/13/2017   History reviewed. No pertinent past medical history. History reviewed. No pertinent surgical history. No Known Allergies Prior to Admission medications   Medication Sig Start Date End Date Taking? Authorizing Provider  acetaminophen (TYLENOL) 500 MG tablet Take 1 tablet (500 mg total) by mouth every 6 (six) hours as needed. 01/15/18  Yes Rutherford Guys, MD  ibuprofen (ADVIL,MOTRIN) 600 MG tablet Take 1 tablet (600 mg total) by mouth every 6 (six) hours as needed. 01/15/18  Yes Rutherford Guys, MD  Social History   Socioeconomic History  . Marital status: Legally Separated    Spouse name: Not on file  . Number of children: Not on file  . Years of education: Not on file  . Highest education level: Not on file  Occupational History  . Not on file  Social Needs  . Financial resource strain: Not on file  . Food insecurity:    Worry: Not on file    Inability: Not on file  . Transportation needs:    Medical: Not on file    Non-medical: Not on file  Tobacco Use  . Smoking status: Never Smoker  .  Smokeless tobacco: Never Used  Substance and Sexual Activity  . Alcohol use: No  . Drug use: No  . Sexual activity: Yes  Lifestyle  . Physical activity:    Days per week: Not on file    Minutes per session: Not on file  . Stress: Not on file  Relationships  . Social connections:    Talks on phone: Not on file    Gets together: Not on file    Attends religious service: Not on file    Active member of club or organization: Not on file    Attends meetings of clubs or organizations: Not on file    Relationship status: Not on file  . Intimate partner violence:    Fear of current or ex partner: Not on file    Emotionally abused: Not on file    Physically abused: Not on file    Forced sexual activity: Not on file  Other Topics Concern  . Not on file  Social History Narrative  . Not on file   Review of Systems  Constitutional: Negative for fatigue and unexpected weight change.  Respiratory: Negative for chest tightness and shortness of breath.   Cardiovascular: Negative for chest pain, palpitations and leg swelling.  Gastrointestinal: Negative for abdominal pain and blood in stool.  Neurological: Negative for dizziness, syncope, light-headedness and headaches.  Psychiatric/Behavioral: Positive for sleep disturbance. The patient is nervous/anxious.        Objective:   Physical Exam  Constitutional: She is oriented to person, place, and time. She appears well-developed and well-nourished.  HENT:  Head: Normocephalic and atraumatic.  Eyes: Pupils are equal, round, and reactive to light. Conjunctivae and EOM are normal.  Neck: Carotid bruit is not present. No thyroid mass and no thyromegaly present.  Cardiovascular: Normal rate, regular rhythm, normal heart sounds and intact distal pulses. Exam reveals no gallop and no friction rub.  No murmur heard. Pulmonary/Chest: Effort normal and breath sounds normal. No respiratory distress. She has no wheezes.  Abdominal: Soft. She exhibits  no distension and no pulsatile midline mass. There is no tenderness.  Neurological: She is alert and oriented to person, place, and time.  Skin: Skin is warm and dry.  Psychiatric: She has a normal mood and affect. Her behavior is normal.  Vitals reviewed.   Vitals:   06/21/18 1620  BP: 114/80  Pulse: 79  Temp: 98.6 F (37 C)  TempSrc: Oral  SpO2: 100%  Weight: 148 lb (67.1 kg)  Height: '5\' 8"'  (1.727 m)       Assessment & Plan:    Vincentina Sollers is a 32 y.o. female Insomnia, unspecified type - Plan: hydrOXYzine (ATARAX/VISTARIL) 25 MG tablet Anxiety state Situational stress Palpitations - Plan: TSH, CBC Family history of hypothyroidism - Plan: TSH  -Suspected situational stressors/adjustment disorder with combination of work, school, and  being a single parent.  Complicating that may be the borderline abnormal labs on screening test recently.  Plan to recheck those as below  -Handout given on stress and stress management, encouraged to adopt some form of exercise most days per week, relaxation strategies discussed  -Trial of hydroxyzine at bedtime, but 1/2 -1 tablet during the day every 6 hours as needed for flare of anxiety symptoms  - check TSH with FH of thyroid disease. Rtc/er precautions if worsening palpitations.   Need for prophylactic vaccination and inoculation against influenza - Plan: Flu Vaccine QUAD 36+ mos IM  Elevated liver function tests - Plan: Comprehensive metabolic panel Hypoglycemia  - reported abnormal on recent testing. Repeat CMP.    Meds ordered this encounter  Medications  . hydrOXYzine (ATARAX/VISTARIL) 25 MG tablet    Sig: Take 0.5-1 tablets (12.5-25 mg total) by mouth every 6 (six) hours as needed for anxiety (or sleep).    Dispense:  30 tablet    Refill:  0   Patient Instructions   I will check some blood work for your symptoms, as well as recheck of possible abnormal testing from your work physical.  However I think some your  symptoms may be due to stress or adjustment disorder.  See information below on these conditions.    Work on some form of relaxation technique each day and especially when you are stressed.  Exercise in some form, even going for a walk every day may also be helpful.  For sleep, can try hydroxyzine 1/2 to 1 pill at bedtime.  Other relaxation techniques before going to bed such as music, warm bath, warm tea may also be helpful.  Dry mouth may have been due to the dose of the other sleep medicine you were taking, but this medication can also cause similar symptoms.  Follow-up in 1 week with Dr. Pamella Pert or I can see you in follow-up as well if needed.  Please follow-up sooner if your symptoms worsen.   Adjustment Disorder, Adult Adjustment disorder is a group of symptoms that can develop after a stressful life event, such as the loss of a job or serious physical illness. The symptoms can affect how you feel, think, and act. They may interfere with your relationships. Adjustment disorder increases your risk of suicide and substance abuse. If this disorder is not managed early, it can develop into a more serious condition, such as major depressive disorder or post-traumatic stress disorder. What are the causes? This condition happens when you have trouble recovering from or coping with a stressful life event. What increases the risk? You are more likely to develop this condition if:  You have had depression or anxiety.  You are being treated for a long-term (chronic) illness.  You are being treated for an illness that cannot be cured (terminal illness).  You have a family history of mental illness.  What are the signs or symptoms? Symptoms of this condition include:  Extreme trouble doing daily tasks, such as going to work.  Sadness, depression, or crying spells.  Worrying a lot.  Loss of enjoyment.  Change in appetite or weight.  Feelings of loss or hopelessness.  Thoughts of  suicide.  Anxiety, worry, or nervousness.  Trouble sleeping.  Avoiding family and friends.  Fighting or vandalism.  Complaining of feeling sick without being ill.  Feeling dazed or disconnected.  Nightmares.  Trouble sleeping.  Irritability.  Reckless driving.  Poor work Systems analyst.  Ignoring bills.  Symptoms of this condition  start within three months of the stressful event. They do not last more than six months, unless the stressful circumstances last longer. Normal grieving after the death of a loved one is not a symptom of this condition. How is this diagnosed? To diagnose this condition, your health care provider will ask about what has happened in your life and how it has affected you. He or she may also ask about your medical history and your use of medicines, alcohol, and other substances. Your health care provider may do a physical exam and order lab tests or other studies. You may be referred to a mental health specialist. How is this treated? Treatment options for this condition include:  Counseling or talk therapy. Talk therapy is usually provided by mental health specialists.  Medicines. Certain medicines may help with depression, anxiety, and sleep.  Support groups. These offer emotional support, advice, and guidance. They are made up of people who have had similar experiences.  Observation and time. This is sometimes called "watchful waiting." In this treatment, health care providers monitor your health and behavior without other treatment. Adjustment disorder sometimes gets better on its own with time.  Follow these instructions at home:  Take over-the-counter and prescription medicines only as told by your health care provider.  Keep all follow-up visits as told by your health care provider. This is important. Contact a health care provider if:  Your symptoms do not improve in six months.  Your symptoms get worse. Get help right away if:  You have  serious thoughts about hurting yourself or someone else. If you ever feel like you may hurt yourself or others, or have thoughts about taking your own life, get help right away. You can go to your nearest emergency department or call:  Your local emergency services (911 in the [place]  A suicide crisis helpline, such as the [place] at (984)173-9974. This is open 24 hours a day.  Summary  Adjustment disorder is a group of symptoms that can develop after a stressful life event, such as the loss of a job or serious physical illness. The symptoms can affect how you feel, think, and act. They may interfere with your relationships.  Symptoms of this condition start within three months of the stressful event. They do not last more than six months, unless the stressful circumstances last longer.  Treatment may include talk therapy, medicines, participation in a support group, or observation to see if symptoms improve.  Contact your health care provider if your symptoms get worse or do not improve in six months.  If you ever feel like you may hurt yourself or others, or have thoughts about taking your own life, get help right away. This information is not intended to replace advice given to you by your health care provider. Make sure you discuss any questions you have with your health care provider. Document Released: 05/06/2006 Document Revised: 10/31/2016 Document Reviewed: 10/31/2016 Elsevier Interactive Patient Education  2018 [employer] and Stress Management Stress is a normal reaction to life events. It is what you feel when life demands more than you are used to or more than you can handle. Some stress can be useful. For example, the stress reaction can help you catch the last bus of the day, study for a test, or meet a deadline at work. But stress that occurs too often or for too long can cause problems. It can affect your emotional health and  interfere with  relationships and normal daily activities. Too much stress can weaken your immune system and increase your risk for physical illness. If you already have a medical problem, stress can make it worse. What are the causes? All sorts of life events may cause stress. An event that causes stress for one person may not be stressful for another person. Major life events commonly cause stress. These may be positive or negative. Examples include losing your job, moving into a new home, getting married, having a baby, or losing a loved one. Less obvious life events may also cause stress, especially if they occur day after day or in combination. Examples include working long hours, driving in traffic, caring for children, being in debt, or being in a difficult relationship. What are the signs or symptoms? Stress may cause emotional symptoms including, the following:  Anxiety. This is feeling worried, afraid, on edge, overwhelmed, or out of control.  Anger. This is feeling irritated or impatient.  Depression. This is feeling sad, down, helpless, or guilty.  Difficulty focusing, remembering, or making decisions.  Stress may cause physical symptoms, including the following:  Aches and pains. These may affect your head, neck, back, stomach, or other areas of your body.  Tight muscles or clenched jaw.  Low energy or trouble sleeping.  Stress may cause unhealthy behaviors, including the following:  Eating to feel better (overeating) or skipping meals.  Sleeping too little, too much, or both.  Working too much or putting off tasks (procrastination).  Smoking, drinking alcohol, or using drugs to feel better.  How is this diagnosed? Stress is diagnosed through an assessment by your health care provider. Your health care provider will ask questions about your symptoms and any stressful life events.Your health care provider will also ask about your medical history and may order blood tests  or other tests. Certain medical conditions and medicine can cause physical symptoms similar to stress. Mental illness can cause emotional symptoms and unhealthy behaviors similar to stress. Your health care provider may refer you to a mental health professional for further evaluation. How is this treated? Stress management is the recommended treatment for stress.The goals of stress management are reducing stressful life events and coping with stress in healthy ways. Techniques for reducing stressful life events include the following:  Stress identification. Self-monitor for stress and identify what causes stress for you. These skills may help you to avoid some stressful events.  Time management. Set your priorities, keep a calendar of events, and learn to say "no." These tools can help you avoid making too many commitments.  Techniques for coping with stress include the following:  Rethinking the problem. Try to think realistically about stressful events rather than ignoring them or overreacting. Try to find the positives in a stressful situation rather than focusing on the negatives.  Exercise. Physical exercise can release both physical and emotional tension. The key is to find a form of exercise you enjoy and do it regularly.  Relaxation techniques. These relax the body and mind. Examples include yoga, meditation, tai chi, biofeedback, deep breathing, progressive muscle relaxation, listening to music, being out in nature, journaling, and other hobbies. Again, the key is to find one or more that you enjoy and can do regularly.  Healthy lifestyle. Eat a balanced diet, get plenty of sleep, and do not smoke. Avoid using alcohol or drugs to relax.  Strong support network. Spend time with family, friends, or other people you enjoy being around.Express your feelings and talk things over  with someone you trust.  Counseling or talktherapy with a mental health professional may be helpful if you are  having difficulty managing stress on your own. Medicine is typically not recommended for the treatment of stress.Talk to your health care provider if you think you need medicine for symptoms of stress. Follow these instructions at home:  Keep all follow-up visits as directed by your health care provider.  Take all medicines as directed by your health care provider. Contact a health care provider if:  Your symptoms get worse or you start having new symptoms.  You feel overwhelmed by your problems and can no longer manage them on your own. Get help right away if:  You feel like hurting yourself or someone else. This information is not intended to replace advice given to you by your health care provider. Make sure you discuss any questions you have with your health care provider. Document Released: 02/25/2001 Document Revised: 02/07/2016 Document Reviewed: 04/26/2013 Elsevier Interactive Patient Education  [employer].     If you have lab work done today you will be contacted with your lab results within the next 2 weeks.  If you have not heard from [place] then please contact us. The fastest way to get your results is to register for My Chart.   IF you received an x-ray today, you will receive an invoice from [facility]. Please contact [facility] at [phone] with questions or concerns regarding your invoice.   IF you received labwork today, you will receive an invoice from [facility]. Please contact [facility] at [phone] with questions or concerns regarding your invoice.   Our billing staff will not be able to assist you with questions regarding bills from these companies.  You will be contacted with the lab results as soon as they are available. The fastest way to get your results is to activate your My Chart account. Instructions are located on the last page of this paperwork. If you have not heard from [place] regarding the results in 2 weeks, please contact this  office.     I personally performed the services described in this documentation, which was scribed in my presence. The recorded information has been reviewed and considered for accuracy and completeness, addended by me as needed, and agree with information above.  Signed,   Merri Ray, MD [facility].  06/22/18 11:15 AM

## 2018-06-22 LAB — TSH: TSH: 2.6 u[IU]/mL (ref 0.450–4.500)

## 2018-06-22 LAB — COMPREHENSIVE METABOLIC PANEL
A/G RATIO: 1.5 (ref 1.2–2.2)
ALT: 10 IU/L (ref 0–32)
AST: 14 IU/L (ref 0–40)
Albumin: 4.6 g/dL (ref 3.5–5.5)
Alkaline Phosphatase: 71 IU/L (ref 39–117)
BILIRUBIN TOTAL: 0.2 mg/dL (ref 0.0–1.2)
BUN/Creatinine Ratio: 15 (ref 9–23)
BUN: 7 mg/dL (ref 6–20)
CHLORIDE: 105 mmol/L (ref 96–106)
CO2: 20 mmol/L (ref 20–29)
Calcium: 9.5 mg/dL (ref 8.7–10.2)
Creatinine, Ser: 0.47 mg/dL — ABNORMAL LOW (ref 0.57–1.00)
GFR, EST AFRICAN AMERICAN: 150 mL/min/{1.73_m2} (ref >59)
GFR, EST NON AFRICAN AMERICAN: 130 mL/min/{1.73_m2} (ref >59)
GLOBULIN, TOTAL: 3 g/dL (ref 1.5–4.5)
Glucose: 82 mg/dL (ref 65–99)
POTASSIUM: 4.2 mmol/L (ref 3.5–5.2)
Sodium: 141 mmol/L (ref 134–144)
TOTAL PROTEIN: 7.6 g/dL (ref 6.0–8.5)

## 2018-06-22 LAB — CBC
HEMOGLOBIN: 12.8 g/dL (ref 11.1–15.9)
Hematocrit: 37.5 % (ref 34.0–46.6)
MCH: 30 pg (ref 26.6–33.0)
MCHC: 34.1 g/dL (ref 31.5–35.7)
MCV: 88 fL (ref 79–97)
PLATELETS: 282 10*3/uL (ref 150–450)
RBC: 4.27 x10E6/uL (ref 3.77–5.28)
RDW: 11.6 % — ABNORMAL LOW (ref 12.3–15.4)
WBC: 7.8 10*3/uL (ref 3.4–10.8)

## 2018-06-23 ENCOUNTER — Telehealth: Payer: Self-pay | Admitting: General Practice

## 2018-06-23 NOTE — Telephone Encounter (Signed)
Copied from CRM [id number]. Topic: General - Other >> Jun 23, 2018  4:34 PM Tamela Oddi wrote: Reason for CRM: Patient called to get her lab results.  Please advise.  CB# [phone].

## 2018-06-24 NOTE — Telephone Encounter (Signed)
Message to Dr. Neva Seat - pt req lab results.  Not released yet.

## 2018-06-25 NOTE — Telephone Encounter (Signed)
See lab notes

## 2018-06-28 ENCOUNTER — Encounter: Payer: Self-pay | Admitting: *Deleted

## 2018-06-28 ENCOUNTER — Ambulatory Visit: Payer: Managed Care, Other (non HMO) | Admitting: Family Medicine

## 2018-11-13 IMAGING — DX DG LUMBAR SPINE 2-3V
3 series · 3 of 3 positions shown · non-contrast
Comparison: None.

CLINICAL DATA: Lumbago

EXAM:
LUMBAR SPINE - 2-3 VIEW

[l-spine ap]
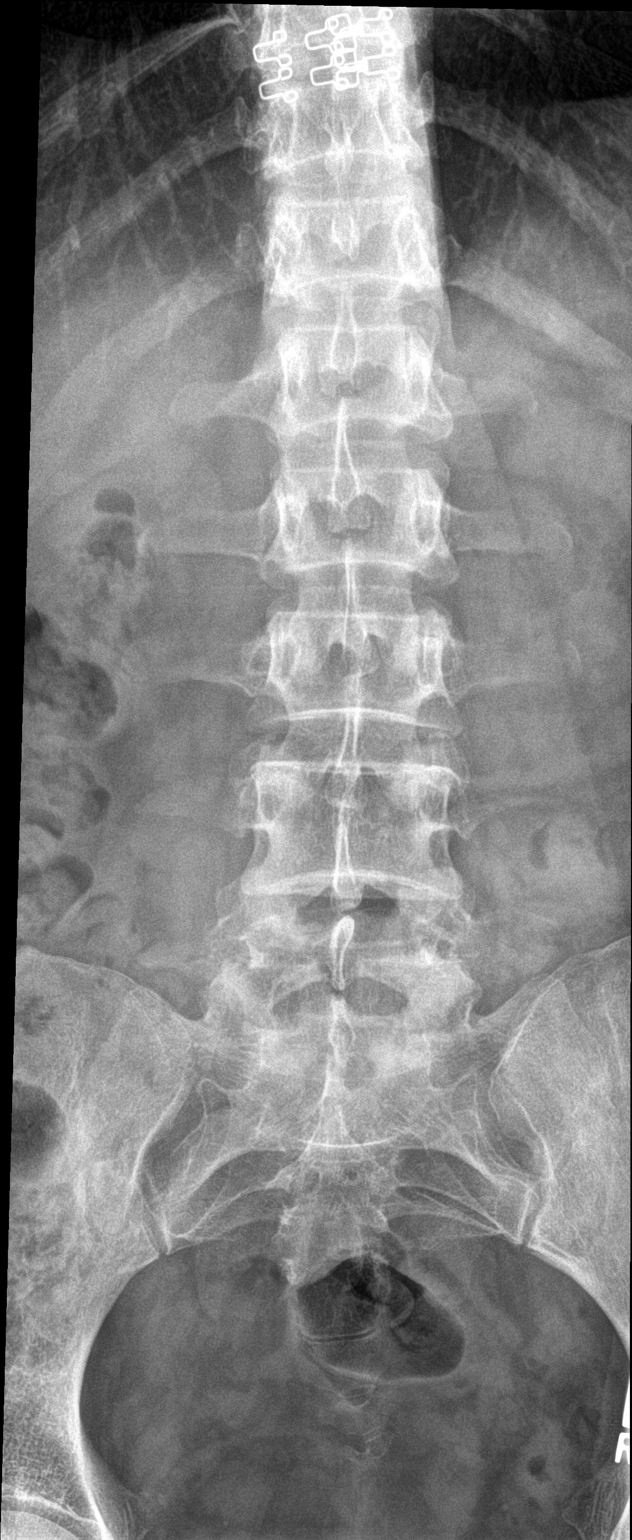

[l-spine lat]
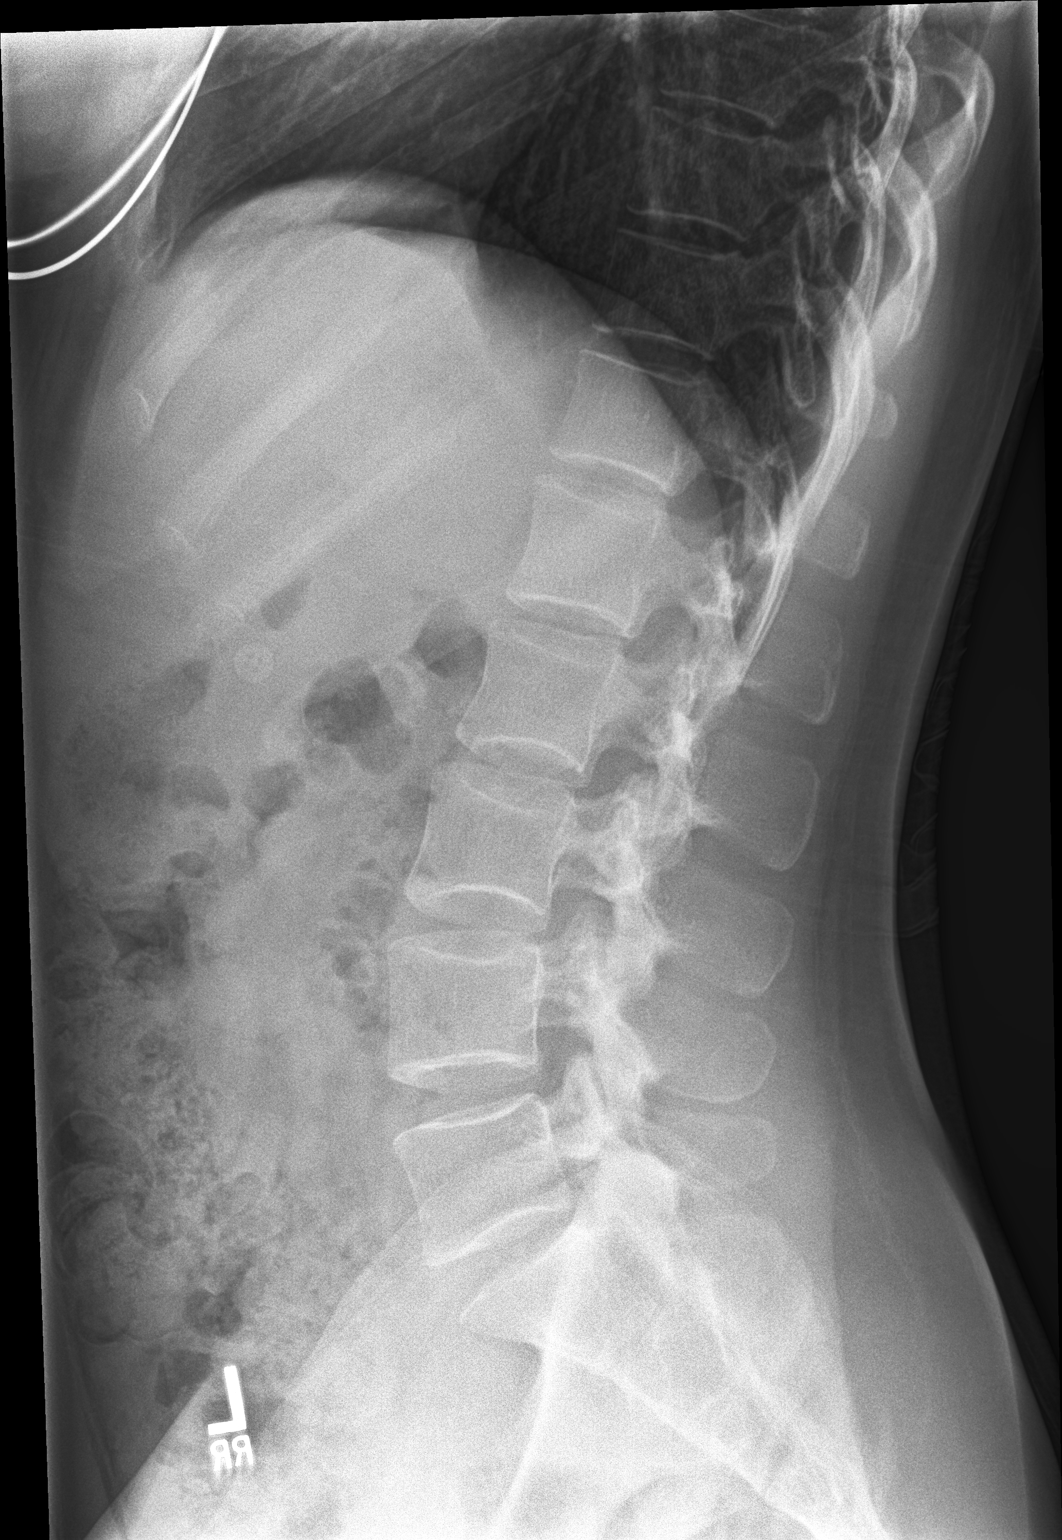

[l-spine l5-s1]
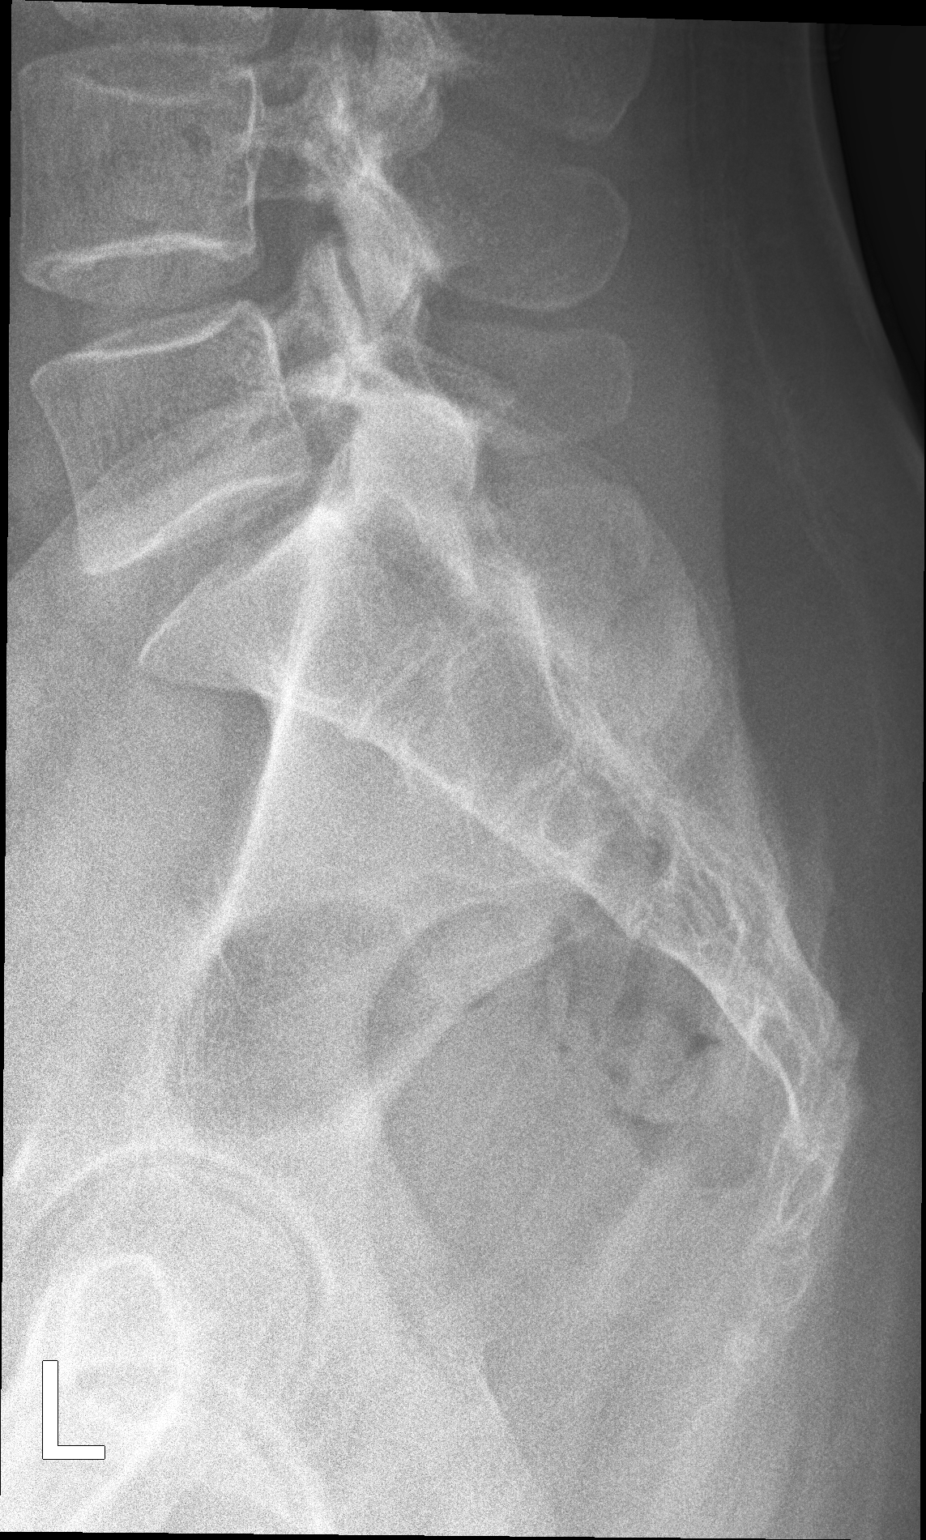

[3 of 3 positions shown; findings below may reference images not displayed]

FINDINGS: Frontal, lateral, and spot lumbosacral lateral images were obtained.
The there are 5 non-rib-bearing lumbar type vertebral bodies. There
is no fracture or spondylolisthesis. The disc spaces appear normal.
No erosive change.
IMPRESSION: No fracture or spondylolisthesis.  No evident arthropathy.

## 2023-06-20 ENCOUNTER — Other Ambulatory Visit: Payer: Self-pay

## 2023-06-20 ENCOUNTER — Ambulatory Visit
Admission: EM | Admit: 2023-06-20 | Discharge: 2023-06-20 | Disposition: A | Discharge status: Home / Self Care | Payer: 59 | Attending: Physician Assistant | Admitting: Physician Assistant

## 2023-06-20 ENCOUNTER — Encounter: Payer: Self-pay | Admitting: *Deleted

## 2023-06-20 DIAGNOSIS — N898 Other specified noninflammatory disorders of vagina: Secondary | ICD-10-CM

## 2023-06-20 DIAGNOSIS — Z113 Encounter for screening for infections with a predominantly sexual mode of transmission: Secondary | ICD-10-CM

## 2023-06-20 LAB — POCT FASTING CBG KUC MANUAL ENTRY: POCT Glucose (KUC): 110 mg/dL — AB (ref 70–99)

## 2023-06-20 NOTE — ED Notes (Addendum)
Pt evaluated by EMS. Declined transport to ED. Reports feeling better. Ambulated from dept with steady gait.

## 2023-06-20 NOTE — ED Notes (Addendum)
During blood draw patient c/o nausea- then became diaphoretic, pale- BP 80/49- Provider at bedside to evaluate. Pt c/o of both hands and feet cramping- Hands noted to be contracting. EMS called per Erma Pinto, PA-C [facility]

## 2023-06-20 NOTE — ED Triage Notes (Signed)
Vaginal discharge x 1 week- thick-white-green; Denies pain and itching

## 2023-06-20 NOTE — ED Provider Notes (Signed)
[facility]    CSN: [id number] Arrival date & time: 06/20/23  0905      History   Chief Complaint Chief Complaint  Patient presents with   Vaginal Discharge    HPI Lisa Mooney is a 37 y.o. female.   Patient here today for eval ration of vaginal discharge she has had for the last week.  She reports that discharge is thick, white and green.  She denies any vaginal pain or itching.  She has not any genital lesions.  She denies any known exposures to STDs.  She states she has not been sexually active in 3 months.  She would like blood work for STD screening.    The history is provided by the patient.  Vaginal Discharge Associated symptoms: no abdominal pain, no dysuria, no fever, no nausea and no vomiting     History reviewed. No pertinent past medical history.  Patient Active Problem List   Diagnosis Date Noted   Amenorrhea due to Depo Provera 01/13/2017   Family history of hyperthyroidism 01/13/2017    Past Surgical History:  Procedure Laterality Date   BREAST SURGERY      OB History   No obstetric history on file.      Home Medications    Prior to Admission medications   Medication Sig Start Date End Date Taking? Authorizing Provider  acetaminophen (TYLENOL) 500 MG tablet Take 1 tablet (500 mg total) by mouth every 6 (six) hours as needed. 01/15/18   Noni Saupe, MD  hydrOXYzine (ATARAX/VISTARIL) 25 MG tablet Take 0.5-1 tablets (12.5-25 mg total) by mouth every 6 (six) hours as needed for anxiety (or sleep). 06/21/18   Shade Flood, MD  ibuprofen (ADVIL,MOTRIN) 600 MG tablet Take 1 tablet (600 mg total) by mouth every 6 (six) hours as needed. 01/15/18   Noni Saupe, MD    Family History Family History  Problem Relation Age of Onset   Healthy Mother    Healthy Father     Social History Social History   Tobacco Use   Smoking status: Never   Smokeless tobacco: Never  Vaping Use   Vaping status: Never Used   Substance Use Topics   Alcohol use: Yes    Comment: rare   Drug use: No     Allergies   Patient has no known allergies.   Review of Systems Review of Systems  Constitutional:  Negative for chills and fever.  Eyes:  Negative for discharge and redness.  Gastrointestinal:  Negative for abdominal pain, diarrhea, nausea and vomiting.  Genitourinary:  Positive for vaginal discharge. Negative for dysuria.     Physical Exam Triage Vital Signs ED Triage Vitals  Encounter Vitals Group     BP 06/20/23 0914 109/67     Systolic BP Percentile --      Diastolic BP Percentile --      Pulse Rate 06/20/23 0914 92     Resp 06/20/23 0914 16     Temp 06/20/23 0914 97.9 F (36.6 C)     Temp Source 06/20/23 0914 Oral     SpO2 06/20/23 0914 98 %     Weight --      Height --      Head Circumference --      Peak Flow --      Pain Score 06/20/23 0915 0     Pain Loc --      Pain Education --      Exclude from  Growth Chart --    No data found.  Updated Vital Signs BP (!) 110/90 (BP Location: Left Arm) Comment (BP Location): taken by ems  Pulse 92   Temp 97.9 F (36.6 C) (Oral)   Resp 16   Wt 145 lb (65.8 kg)   SpO2 98%   BMI 22.05 kg/m      Physical Exam Vitals and nursing note reviewed.  Constitutional:      General: She is not in acute distress.    Appearance: Normal appearance. She is not ill-appearing.  HENT:     Head: Normocephalic and atraumatic.  Eyes:     Conjunctiva/sclera: Conjunctivae normal.  Cardiovascular:     Rate and Rhythm: Normal rate.  Pulmonary:     Effort: Pulmonary effort is normal. No respiratory distress.  Neurological:     Mental Status: She is alert.  Psychiatric:        Mood and Affect: Mood normal.        Behavior: Behavior normal.        Thought Content: Thought content normal.      UC Treatments / Results  Labs (all labs ordered are listed, but only abnormal results are displayed) Labs Reviewed  POCT FASTING CBG [facility] MANUAL ENTRY -  Abnormal; Notable for the following components:      Result Value   POCT Glucose ([facility]) 110 (*)    All other components within normal limits  RPR  HIV ANTIBODY (ROUTINE TESTING W REFLEX)  HEPATITIS PANEL, ACUTE  CERVICOVAGINAL ANCILLARY ONLY    EKG   Radiology No results found.  Procedures Procedures (including critical care time)  Medications Ordered in UC Medications - No data to display  Initial Impression / Assessment and Plan / UC Course  I have reviewed the triage vital signs and the nursing notes.  Pertinent labs & imaging results that were available during my care of the patient were reviewed by me and considered in my medical decision making (see chart for details).    During blood draw patient became diaphoretic and nauseous.  She appeared to be having vasovagal response and started to have cramping in her hands.  Patient reports she has not eaten anything today and was provided soda and teddy grams.  She notes after eating some and using ice pack that symptoms had improved.  EMS did arrive to evaluate patient.  STD screening ordered as requested.  Will await results for further recommendation.  Advised to abstain from sexual activity while awaiting results.  Final Clinical Impressions(s) / UC Diagnoses   Final diagnoses:  Screening for STD (sexually transmitted disease)  Vaginal discharge   Discharge Instructions   None    ED Prescriptions   None    PDMP not reviewed this encounter.   Tomi Bamberger, PA-C 06/20/23 1059

## 2023-06-20 NOTE — ED Notes (Signed)
EMS at bedside to evaluate patient

## 2023-06-21 LAB — RPR: RPR Ser Ql: NONREACTIVE

## 2023-06-21 LAB — HIV ANTIBODY (ROUTINE TESTING W REFLEX): HIV Screen 4th Generation wRfx: NONREACTIVE

## 2023-06-22 ENCOUNTER — Telehealth: Payer: Self-pay

## 2023-06-22 LAB — CERVICOVAGINAL ANCILLARY ONLY
Bacterial Vaginitis (gardnerella): NEGATIVE
Candida Glabrata: NEGATIVE
Candida Vaginitis: POSITIVE — AB
Chlamydia: NEGATIVE
Comment: NEGATIVE
Comment: NEGATIVE
Comment: NEGATIVE
Comment: NEGATIVE
Comment: NEGATIVE
Comment: NORMAL
Neisseria Gonorrhea: NEGATIVE
Trichomonas: NEGATIVE

## 2023-06-22 MED ORDER — FLUCONAZOLE 150 MG PO TABS: 150.0000 mg | ORAL_TABLET | Freq: Once | ORAL | 0 refills | Status: AC

## 2023-06-22 NOTE — Telephone Encounter (Signed)
Per protocol, pt requires tx with Diflucan.  Rx sent to pharmacy on file.

## 2024-04-25 ENCOUNTER — Encounter (HOSPITAL_COMMUNITY): Payer: Self-pay | Admitting: Emergency Medicine

## 2024-04-25 ENCOUNTER — Emergency Department (HOSPITAL_COMMUNITY)

## 2024-04-25 ENCOUNTER — Emergency Department (HOSPITAL_COMMUNITY)
Admission: EM | Admit: 2024-04-25 | Discharge: 2024-04-26 | Discharge status: Against Medical Advice | Attending: Emergency Medicine | Admitting: Emergency Medicine

## 2024-04-25 ENCOUNTER — Other Ambulatory Visit: Payer: Self-pay

## 2024-04-25 DIAGNOSIS — S025XXA Fracture of tooth (traumatic), initial encounter for closed fracture: Secondary | ICD-10-CM | POA: Diagnosis present

## 2024-04-25 DIAGNOSIS — Z5329 Procedure and treatment not carried out because of patient's decision for other reasons: Secondary | ICD-10-CM | POA: Insufficient documentation

## 2024-04-25 DIAGNOSIS — S0993XA Unspecified injury of face, initial encounter: Secondary | ICD-10-CM

## 2024-04-25 DIAGNOSIS — R55 Syncope and collapse: Secondary | ICD-10-CM | POA: Diagnosis not present

## 2024-04-25 DIAGNOSIS — W1830XA Fall on same level, unspecified, initial encounter: Secondary | ICD-10-CM | POA: Insufficient documentation

## 2024-04-25 LAB — COMPREHENSIVE METABOLIC PANEL WITH GFR
ALT: 22 U/L (ref 0–44)
AST: 31 U/L (ref 15–41)
Albumin: 3.8 g/dL (ref 3.5–5.0)
Alkaline Phosphatase: 61 U/L (ref 38–126)
Anion gap: 11 (ref 5–15)
BUN: 17 mg/dL (ref 6–20)
CO2: 19 mmol/L — ABNORMAL LOW (ref 22–32)
Calcium: 8.9 mg/dL (ref 8.9–10.3)
Chloride: 105 mmol/L (ref 98–111)
Creatinine, Ser: 0.6 mg/dL (ref 0.44–1.00)
GFR, Estimated: >60 mL/min (ref >60)
Glucose, Bld: 121 mg/dL — ABNORMAL HIGH (ref 70–99)
Potassium: 3.7 mmol/L (ref 3.5–5.1)
Sodium: 135 mmol/L (ref 135–145)
Total Bilirubin: 0.8 mg/dL (ref 0.0–1.2)
Total Protein: 7.2 g/dL (ref 6.5–8.1)

## 2024-04-25 LAB — TROPONIN I (HIGH SENSITIVITY): Troponin I (High Sensitivity): <2 ng/L (ref <18)

## 2024-04-25 LAB — CBC
HCT: 36.8 % (ref 36.0–46.0)
Hemoglobin: 12.3 g/dL (ref 12.0–15.0)
MCH: 29.8 pg (ref 26.0–34.0)
MCHC: 33.4 g/dL (ref 30.0–36.0)
MCV: 89.1 fL (ref 80.0–100.0)
Platelets: 282 K/uL (ref 150–400)
RBC: 4.13 MIL/uL (ref 3.87–5.11)
RDW: 11.2 % — ABNORMAL LOW (ref 11.5–15.5)
WBC: 11.4 K/uL — ABNORMAL HIGH (ref 4.0–10.5)
nRBC: 0 % (ref 0.0–0.2)

## 2024-04-25 LAB — HCG, SERUM, QUALITATIVE: Preg, Serum: NEGATIVE

## 2024-04-25 LAB — CBG MONITORING, ED: Glucose-Capillary: 119 mg/dL — ABNORMAL HIGH (ref 70–99)

## 2024-04-25 MED ORDER — LIDOCAINE 5 % EX PTCH
2.0000 | MEDICATED_PATCH | CUTANEOUS | Status: DC
  Administered 2024-04-25 (×2): 2 via TRANSDERMAL
  Filled 2024-04-25: qty 2

## 2024-04-25 MED ORDER — ACETAMINOPHEN 500 MG PO TABS
1000.0000 mg | ORAL_TABLET | Freq: Once | ORAL | Status: AC
  Administered 2024-04-25 (×2): 1000 mg via ORAL
  Filled 2024-04-25: qty 2

## 2024-04-25 NOTE — ED Triage Notes (Signed)
 BIBA Per EMS: Pt coming from home w/ c/o dizziness while sitting on toilet. When she was walking back to bed pt Had syncopal episode & fell onto a pole. 2 chipped teeth when she fell.  90/60 initial BP  22G L hand  400cc given en route  124/80 current BP  152 CBG

## 2024-04-25 NOTE — ED Provider Notes (Signed)
 [place] [facility] AT [facility] Provider Note   CSN: [id number] Arrival date & time: 04/25/24  2146     Patient presents with: Loss of Consciousness   Lisa Mooney is a 38 y.o. female.   The history is provided by the patient and a relative.  Loss of Consciousness Episode history:  Single Most recent episode:  Today Duration: seconds. Timing:  Rare Progression:  Resolved Chronicity:  New Context: urination   Relieved by:  Nothing Worsened by:  Nothing Ineffective treatments:  None tried Associated symptoms: no fever, no palpitations, no shortness of breath, no visual change, no vomiting and no weakness   Associated symptoms comment:  Chipped teeth  Risk factors: no congenital heart disease and no seizures        Prior to Admission medications   Medication Sig Start Date End Date Taking? Authorizing Provider  acetaminophen  (TYLENOL ) 500 MG tablet Take 1 tablet (500 mg total) by mouth every 6 (six) hours as needed. 01/15/18   Melonie Colonel, Mikel HERO, MD  hydrOXYzine  (ATARAX /VISTARIL ) 25 MG tablet Take 0.5-1 tablets (12.5-25 mg total) by mouth every 6 (six) hours as needed for anxiety (or sleep). 06/21/18   Levora Reyes SAUNDERS, MD  ibuprofen  (ADVIL ,MOTRIN ) 600 MG tablet Take 1 tablet (600 mg total) by mouth every 6 (six) hours as needed. 01/15/18   Melonie Colonel Mikel HERO, MD    Allergies: Patient has no known allergies.    Review of Systems  Constitutional:  Negative for fever.  HENT:  Positive for dental problem. Negative for facial swelling.   Respiratory:  Negative for shortness of breath, wheezing and stridor.   Cardiovascular:  Positive for syncope. Negative for palpitations and leg swelling.  Gastrointestinal:  Negative for vomiting.  Neurological:  Negative for weakness.  All other systems reviewed and are negative.   Updated Vital Signs BP 97/74 (BP Location: Left Arm)   Pulse 74   Temp 98 F (36.7 C) (Oral)   Resp 18   SpO2  100%   Physical Exam Vitals and nursing note reviewed.  Constitutional:      General: She is not in acute distress.    Appearance: Normal appearance. She is well-developed.  HENT:     Head: Normocephalic and atraumatic.     Nose: Nose normal.     Mouth/Throat:   Eyes:     Pupils: Pupils are equal, round, and reactive to light.  Cardiovascular:     Rate and Rhythm: Normal rate and regular rhythm.     Pulses: Normal pulses.     Heart sounds: Normal heart sounds.  Pulmonary:     Effort: Pulmonary effort is normal. No respiratory distress.     Breath sounds: Normal breath sounds.  Abdominal:     General: Bowel sounds are normal. There is no distension.     Palpations: Abdomen is soft.     Tenderness: There is no abdominal tenderness. There is no guarding or rebound.  Musculoskeletal:        General: Normal range of motion.     Cervical back: Normal range of motion and neck supple.  Skin:    General: Skin is warm and dry.     Capillary Refill: Capillary refill takes less than 2 seconds.     Findings: No erythema or rash.  Neurological:     General: No focal deficit present.     Mental Status: She is alert.     Deep Tendon Reflexes: Reflexes normal.  Psychiatric:  Mood and Affect: Mood normal.     (all labs ordered are listed, but only abnormal results are displayed) Results for orders placed or performed during the hospital encounter of 04/25/24  Comprehensive metabolic panel   Collection Time: 04/25/24 10:13 PM  Result Value Ref Range   Sodium 135 135 - 145 mmol/L   Potassium 3.7 3.5 - 5.1 mmol/L   Chloride 105 98 - 111 mmol/L   CO2 19 (L) 22 - 32 mmol/L   Glucose, Bld 121 (H) 70 - 99 mg/dL   BUN 17 6 - 20 mg/dL   Creatinine, Ser 9.39 0.44 - 1.00 mg/dL   Calcium 8.9 8.9 - 89.6 mg/dL   Total Protein 7.2 6.5 - 8.1 g/dL   Albumin 3.8 3.5 - 5.0 g/dL   AST 31 15 - 41 U/L   ALT 22 0 - 44 U/L   Alkaline Phosphatase 61 38 - 126 U/L   Total Bilirubin 0.8 0.0 - 1.2  mg/dL   GFR, Estimated >39 >39 mL/min   Anion gap 11 5 - 15  CBC   Collection Time: 04/25/24 10:13 PM  Result Value Ref Range   WBC 11.4 (H) 4.0 - 10.5 K/uL   RBC 4.13 3.87 - 5.11 MIL/uL   Hemoglobin 12.3 12.0 - 15.0 g/dL   HCT 63.1 63.9 - 53.9 %   MCV 89.1 80.0 - 100.0 fL   MCH 29.8 26.0 - 34.0 pg   MCHC 33.4 30.0 - 36.0 g/dL   RDW 88.7 (L) 88.4 - 84.4 %   Platelets 282 150 - 400 K/uL   nRBC 0.0 0.0 - 0.2 %  hCG, serum, qualitative   Collection Time: 04/25/24 10:14 PM  Result Value Ref Range   Preg, Serum NEGATIVE NEGATIVE   No results found.   EKG: EKG Interpretation Date/Time:  Monday April 25 2024 21:59:47 EDT Ventricular Rate:  72 PR Interval:  157 QRS Duration:  103 QT Interval:  402 QTC Calculation: 440 R Axis:   79  Text Interpretation: Sinus rhythm Confirmed by Nettie, Trenton Passow ([id number]) on 04/25/2024 11:01:42 PM  Radiology: No results found.   Procedures   Medications Ordered in the ED  acetaminophen  (TYLENOL ) tablet 1,000 mg (has no administration in time range)  lidocaine  (LIDODERM ) 5 % 2 patch (has no administration in time range)                                    Medical Decision Making Urinating and passed out for a few seconds.    Amount and/or Complexity of Data Reviewed Independent Historian:     Details: Daughter see above  External Data Reviewed: notes.    Details: Previous notes reviewed  Labs: ordered.    Details: Normal sodium 135, normal potassium 3.7, normal creatinine. Pregnancy is negative. White count is 11.4, normal hemoglobin 12.3 Radiology: ordered and independent interpretation performed.    Details: Negative CT head  ECG/medicine tests: ordered and independent interpretation performed. Decision-making details documented in ED Course.  Risk OTC drugs. Prescription drug management. Risk Details: Well appearing with intact exam and vitals which are benign and reassuring.  Patient is not orthostatic.  No SOB.  PERC negative  Wells 0.  Highly doubt PE in this low risk patient.  Awaiting urine.  Nurse reported to me that patient left AMA     Final diagnoses:  None    Patient left [facility]    ED  Discharge Orders     None          Brodan Grewell, MD 04/26/24 [id number]

## 2024-04-26 ENCOUNTER — Encounter

## 2024-04-26 LAB — URINALYSIS, ROUTINE W REFLEX MICROSCOPIC
Bacteria, UA: NONE SEEN
Bilirubin Urine: NEGATIVE
Glucose, UA: NEGATIVE mg/dL
Ketones, ur: 20 mg/dL — AB
Leukocytes,Ua: NEGATIVE
Nitrite: NEGATIVE
Protein, ur: NEGATIVE mg/dL
Specific Gravity, Urine: 1.029 (ref 1.005–1.030)
pH: 5 (ref 5.0–8.0)

## 2024-04-26 NOTE — ED Notes (Signed)
 Patient resting in bed with family at bedside.

## 2024-04-26 NOTE — ED Notes (Addendum)
 Patient decided she wants to stay at this time for her results. Nurse educated patient again on why the AMA form is to be signed if she wants to leave before provider d/c her. Patient stated I understand Its just my parents being at home Im not trying to give you guys a hard time Nurse acknowledged patient concern and let her know that we are moving as fast as we can to get her results and provider to d/c her if safe to due so.  Patient calm sitting in chair in room with daughter at bedside.

## 2024-04-26 NOTE — ED Notes (Signed)
 Patient came to her room door and asked to sign AMA form so she could leave. Nurse to room for patient to sign form at this time.

## 2024-04-26 NOTE — ED Notes (Addendum)
 Lisa Mooney

## 2024-04-26 NOTE — ED Notes (Addendum)
 SABRA

## 2024-04-26 NOTE — ED Notes (Signed)
 Provider at bedside with primary RN and Charge RN to explain the delay in discharge. Provider apologizes for the delay in CT results and advises patient that we are in need of a urine sample for dispo. Pt. Able to provide urine sample.

## 2024-04-26 NOTE — ED Provider Notes (Signed)
 Told patient wanted to leave and wanted her results.  CT and CXR were not resulted.    Went to room with charge nurse to give results and ask for urine which was ordered in triage but still pending.    Patient is irate stating someone was rude to her.  EDP (sitting) apologized.  Patient then yelled and stated no one asked her for urine and why do we need this.  EDP again apologized for this.  EDP listened to patient frustration.  EDP again apologized.  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 EDP apologized   EDP stated CXR and CT results had just returned as imaging results have been delayed this evening and again apologized.    Patient is now giving a urine specimen.     Diamante Rubin, MD 04/26/24 [id number]

## 2024-04-26 NOTE — ED Notes (Signed)
 Patient wants to leave AMA at this time. Provider notified.
